# Patient Record
Sex: Female | Born: 1950 | Race: White | Hispanic: No | Smoking: Never smoker
Health system: Southern US, Community
[De-identification: ages and names within clinical notes are randomized; demographics above are authoritative.]

## PROBLEM LIST (undated history)

## (undated) DIAGNOSIS — H269 Unspecified cataract: Secondary | ICD-10-CM

## (undated) DIAGNOSIS — Z9889 Other specified postprocedural states: Secondary | ICD-10-CM

## (undated) DIAGNOSIS — I10 Essential (primary) hypertension: Secondary | ICD-10-CM

## (undated) DIAGNOSIS — R112 Nausea with vomiting, unspecified: Secondary | ICD-10-CM

## (undated) HISTORY — PX: HAND SURGERY: SHX662

## (undated) HISTORY — PX: OTHER SURGICAL HISTORY: SHX169

---

## 1975-12-02 HISTORY — PX: INNER EAR SURGERY: SHX679

## 1998-12-01 HISTORY — PX: FINGER SURGERY: SHX640

## 1999-06-27 ENCOUNTER — Ambulatory Visit (HOSPITAL_BASED_OUTPATIENT_CLINIC_OR_DEPARTMENT_OTHER): Admission: RE | Admit: 1999-06-27 | Discharge: 1999-06-27 | Payer: Self-pay | Admitting: Orthopedic Surgery

## 2002-06-30 ENCOUNTER — Encounter: Payer: Self-pay | Admitting: *Deleted

## 2002-06-30 ENCOUNTER — Ambulatory Visit (HOSPITAL_COMMUNITY): Admission: RE | Admit: 2002-06-30 | Discharge: 2002-06-30 | Payer: Self-pay | Admitting: *Deleted

## 2003-07-07 ENCOUNTER — Ambulatory Visit (HOSPITAL_COMMUNITY): Admission: RE | Admit: 2003-07-07 | Discharge: 2003-07-07 | Payer: Self-pay | Admitting: *Deleted

## 2003-07-07 ENCOUNTER — Encounter: Payer: Self-pay | Admitting: *Deleted

## 2004-06-13 ENCOUNTER — Other Ambulatory Visit: Admission: RE | Admit: 2004-06-13 | Discharge: 2004-06-13 | Payer: Self-pay | Admitting: *Deleted

## 2004-07-09 ENCOUNTER — Ambulatory Visit (HOSPITAL_COMMUNITY): Admission: RE | Admit: 2004-07-09 | Discharge: 2004-07-09 | Payer: Self-pay | Admitting: *Deleted

## 2004-09-03 ENCOUNTER — Ambulatory Visit (HOSPITAL_COMMUNITY): Admission: RE | Admit: 2004-09-03 | Discharge: 2004-09-03 | Payer: Self-pay | Admitting: Internal Medicine

## 2005-07-21 ENCOUNTER — Ambulatory Visit (HOSPITAL_COMMUNITY): Admission: RE | Admit: 2005-07-21 | Discharge: 2005-07-21 | Payer: Self-pay | Admitting: *Deleted

## 2006-08-14 ENCOUNTER — Ambulatory Visit (HOSPITAL_COMMUNITY): Admission: RE | Admit: 2006-08-14 | Discharge: 2006-08-14 | Payer: Self-pay | Admitting: Family Medicine

## 2006-08-20 ENCOUNTER — Ambulatory Visit (HOSPITAL_COMMUNITY): Admission: RE | Admit: 2006-08-20 | Discharge: 2006-08-20 | Payer: Self-pay | Admitting: Family Medicine

## 2006-11-10 ENCOUNTER — Ambulatory Visit (HOSPITAL_COMMUNITY): Admission: RE | Admit: 2006-11-10 | Discharge: 2006-11-10 | Payer: Self-pay | Admitting: Family Medicine

## 2006-11-16 ENCOUNTER — Ambulatory Visit: Payer: Self-pay | Admitting: Orthopedic Surgery

## 2006-11-19 ENCOUNTER — Ambulatory Visit: Payer: Self-pay | Admitting: Orthopedic Surgery

## 2007-09-13 ENCOUNTER — Ambulatory Visit (HOSPITAL_COMMUNITY): Admission: RE | Admit: 2007-09-13 | Discharge: 2007-09-13 | Payer: Self-pay | Admitting: Family Medicine

## 2007-12-06 ENCOUNTER — Ambulatory Visit (HOSPITAL_COMMUNITY): Admission: RE | Admit: 2007-12-06 | Discharge: 2007-12-06 | Payer: Self-pay | Admitting: Family Medicine

## 2008-10-25 ENCOUNTER — Ambulatory Visit (HOSPITAL_COMMUNITY): Admission: RE | Admit: 2008-10-25 | Discharge: 2008-10-25 | Payer: Self-pay | Admitting: Family Medicine

## 2009-11-14 ENCOUNTER — Ambulatory Visit (HOSPITAL_COMMUNITY): Admission: RE | Admit: 2009-11-14 | Discharge: 2009-11-14 | Payer: Self-pay | Admitting: Family Medicine

## 2010-11-18 ENCOUNTER — Ambulatory Visit (HOSPITAL_COMMUNITY)
Admission: RE | Admit: 2010-11-18 | Discharge: 2010-11-18 | Payer: Self-pay | Source: Home / Self Care | Attending: Internal Medicine | Admitting: Internal Medicine

## 2011-02-25 ENCOUNTER — Ambulatory Visit (HOSPITAL_COMMUNITY)
Admission: RE | Admit: 2011-02-25 | Discharge: 2011-02-25 | Disposition: A | Discharge status: Home / Self Care | Payer: BC Managed Care – PPO | Source: Ambulatory Visit | Attending: Family Medicine | Admitting: Family Medicine

## 2011-02-25 ENCOUNTER — Other Ambulatory Visit (HOSPITAL_COMMUNITY): Payer: Self-pay | Admitting: Family Medicine

## 2011-02-25 ENCOUNTER — Encounter (HOSPITAL_COMMUNITY): Payer: Self-pay

## 2011-02-25 DIAGNOSIS — M79601 Pain in right arm: Secondary | ICD-10-CM

## 2011-02-25 DIAGNOSIS — W19XXXA Unspecified fall, initial encounter: Secondary | ICD-10-CM | POA: Insufficient documentation

## 2011-02-25 DIAGNOSIS — M25519 Pain in unspecified shoulder: Secondary | ICD-10-CM | POA: Insufficient documentation

## 2011-02-25 DIAGNOSIS — S46909A Unspecified injury of unspecified muscle, fascia and tendon at shoulder and upper arm level, unspecified arm, initial encounter: Secondary | ICD-10-CM | POA: Insufficient documentation

## 2011-02-25 DIAGNOSIS — M25529 Pain in unspecified elbow: Secondary | ICD-10-CM | POA: Insufficient documentation

## 2011-02-25 DIAGNOSIS — M25511 Pain in right shoulder: Secondary | ICD-10-CM

## 2011-02-25 DIAGNOSIS — S4980XA Other specified injuries of shoulder and upper arm, unspecified arm, initial encounter: Secondary | ICD-10-CM | POA: Insufficient documentation

## 2011-03-28 ENCOUNTER — Encounter: Payer: Self-pay | Admitting: Orthopedic Surgery

## 2011-04-01 ENCOUNTER — Encounter: Payer: Self-pay | Admitting: Orthopedic Surgery

## 2011-04-01 ENCOUNTER — Ambulatory Visit (INDEPENDENT_AMBULATORY_CARE_PROVIDER_SITE_OTHER): Payer: BC Managed Care – PPO | Admitting: Orthopedic Surgery

## 2011-04-01 VITALS — HR 68 | Resp 16 | Ht 64.0 in | Wt 140.0 lb

## 2011-04-01 DIAGNOSIS — M25511 Pain in right shoulder: Secondary | ICD-10-CM

## 2011-04-01 DIAGNOSIS — M25519 Pain in unspecified shoulder: Secondary | ICD-10-CM

## 2011-04-01 DIAGNOSIS — M25619 Stiffness of unspecified shoulder, not elsewhere classified: Secondary | ICD-10-CM

## 2011-04-01 MED ORDER — ACETAMINOPHEN-CODEINE 300-30 MG PO TABS: 1.0000 | ORAL_TABLET | ORAL | Status: DC | PRN

## 2011-04-01 NOTE — Patient Instructions (Signed)
Start Phys Therapy   You have received a steroid shot. 15% of patients experience increased pain at the injection site with in the next 24 hours. This is best treated with ice and tylenol extra strength 2 tabs every 8 hours. If you are still having pain please call the office.   Take medications   nabumetone  robaxin  tylenol with codeine

## 2011-04-01 NOTE — Progress Notes (Signed)
Pain RIGHT arm  Date of injury March of this year  Mechanism patient fell her RIGHT mid humerus head against a chair.  Since that time complains of throbbing mild constant pain worse with motion.  Actually her range of motion is decreased she does get some relief from Relafen and Robaxin  Intensity of pain as 2/10.  Review of systems is reviewed all systems.  All systems negative except for complain of muscle pain.  No ALLERGIES.  No major medical problems.  She did have surgery on her LEFT hand in 2000.  Primary care physician [facility]  Current medications fish oil, Centrum, Robaxin, Relafen.  Family history negative  Social history patient is widowed.  Does not smoke or drink.  Vital signs are recorded and are stable.  General appearance is normal.  Patient oriented x3.  Mood and affect normal.  Ambulation normal.  Cervical spine nontender normal range of motion.  LEFT shoulder inspection normal, range of motion normal.  Strength normal.  Stability test are normal.  Skin is intact and normal.  RIGHT shoulder she is decreased external rotation abduction and forward elevation.  This is painful for her.  She has tenderness in the mid humerus and posterior to the subacromial space.  The shoulder however is stable.  She has weakness in internal/external rotation as well as forward elevation.  Skin is intact.  There is no cervical enlarged lymph nodes.  Sensory exam is normal reflexes are 2+ and equal.  Diagnosis shoulder pain with contusion complicated by adhesive capsulitis.  Clinically does not have rotator cuff tear

## 2011-04-01 NOTE — Discharge Summary (Signed)
Separately identifiable procedure report  Informed consent was obtained verbally.  Time out was taken.  RIGHT shoulder was injected subacromial space.   Alcohol was used to prep the shoulder, along with ethyl chloride.  40 mg of Depo-Medrol and 3 cc of 1% lidocaine was injected into the subacromial space.  There were no complications 

## 2011-04-08 ENCOUNTER — Ambulatory Visit (HOSPITAL_COMMUNITY)
Admission: RE | Admit: 2011-04-08 | Discharge: 2011-04-08 | Disposition: A | Discharge status: Home / Self Care | Payer: BC Managed Care – PPO | Source: Ambulatory Visit | Attending: Orthopedic Surgery | Admitting: Orthopedic Surgery

## 2011-04-08 DIAGNOSIS — M25519 Pain in unspecified shoulder: Secondary | ICD-10-CM | POA: Insufficient documentation

## 2011-04-08 DIAGNOSIS — IMO0001 Reserved for inherently not codable concepts without codable children: Secondary | ICD-10-CM | POA: Insufficient documentation

## 2011-04-08 DIAGNOSIS — M25619 Stiffness of unspecified shoulder, not elsewhere classified: Secondary | ICD-10-CM | POA: Insufficient documentation

## 2011-04-08 DIAGNOSIS — M6281 Muscle weakness (generalized): Secondary | ICD-10-CM | POA: Insufficient documentation

## 2011-04-10 ENCOUNTER — Ambulatory Visit (HOSPITAL_COMMUNITY)
Admission: RE | Admit: 2011-04-10 | Discharge: 2011-04-10 | Disposition: A | Discharge status: Home / Self Care | Payer: BC Managed Care – PPO | Source: Ambulatory Visit | Attending: *Deleted | Admitting: *Deleted

## 2011-04-11 ENCOUNTER — Telehealth: Payer: Self-pay | Admitting: Orthopedic Surgery

## 2011-04-11 NOTE — Telephone Encounter (Signed)
Patient is doing physical therapy as ordered and states would like medication for inflammation, as current one not helping.  Her pharmacy is [facility] in [place].

## 2011-04-14 NOTE — Telephone Encounter (Signed)
Stop nabumetone   Start advil 800 mg 3 times a day

## 2011-04-15 ENCOUNTER — Telehealth: Payer: Self-pay | Admitting: Orthopedic Surgery

## 2011-04-15 NOTE — Telephone Encounter (Signed)
Patient received Dr Mort Sawyers instructions per last telephone note  To stop Nabumetone and start Advil 800 mg 3 times a day.

## 2011-04-15 NOTE — Telephone Encounter (Signed)
Reached and advised patient per Dr Mort Sawyers instructions.

## 2011-04-16 ENCOUNTER — Ambulatory Visit (HOSPITAL_COMMUNITY)
Admission: RE | Admit: 2011-04-16 | Discharge: 2011-04-16 | Disposition: A | Discharge status: Home / Self Care | Payer: BC Managed Care – PPO | Source: Ambulatory Visit | Attending: *Deleted | Admitting: *Deleted

## 2011-04-17 ENCOUNTER — Ambulatory Visit (HOSPITAL_COMMUNITY)
Admission: RE | Admit: 2011-04-17 | Discharge: 2011-04-17 | Disposition: A | Discharge status: Home / Self Care | Payer: BC Managed Care – PPO | Source: Ambulatory Visit | Attending: *Deleted | Admitting: *Deleted

## 2011-04-18 NOTE — Op Note (Signed)
NAMEJINAN, Rachael Rios                 ACCOUNT NO.:  [id number]   MEDICAL RECORD NO.:  [id number]          PATIENT TYPE:  AMB   LOCATION:  DAY                           FACILITY:  [facility]   PHYSICIAN:  R. Roetta Sessions, M.D. DATE OF BIRTH:  Dec 19, 1950   DATE OF PROCEDURE:  09/03/2004  DATE OF DISCHARGE:                                 OPERATIVE REPORT   PROCEDURE:  Colonoscopy.   INDICATIONS:  This is a 59 year old lady devoid of any GI symptoms, referred  at the courtesy of Dr. Lisette Grinder for colorectal cancer screening.  There is no  family history of colorectal neoplasia.  She reports never having  colonoscopy previously.  Colonoscopy is now being done as a standard  screening maneuver.  This procedure has been discussed with the patient at  length.  The potential risks, benefits and alternatives have been reviewed.  Questions answered and she is agreeable.  Please see my handwritten H&P for  more information.   DESCRIPTION OF PROCEDURE:  Oxygen saturation, blood pressure, pulse and  respiration were monitored throughout the entire procedure.  Conscious  sedation with Versed 3 mg IV and Demerol 75 mg IV in divided doses.  The  instrument was the Olympus video chip system.   FINDINGS:  Digital rectal exam initially revealed no abnormalities.   ENDOSCOPIC FINDINGS:  Prep was good.   Rectum:  Examination of the rectal mucosa including retroflexion in the anal  verge revealed only internal hemorrhoids.   Colon:  Colonic mucosa was surveyed from the rectosigmoid junction through  the left, transverse,  right colon to the area of the appendiceal orifice,  ileocecal valve and cecum. These structures were well seen and photographed  for the record.  From this level, the scope was slowly withdrawn.  All  previously mentioned mucosal surfaces were again seen.  The colonic mucosa  appeared normal.  The patient tolerated the procedure well and was reactive  after endoscopy.   IMPRESSION:  1.   Normal rectum.  2.  Normal colon.   RECOMMENDATIONS:  Repeat colonoscopy 10 years.     Otelia Sergeant   RMR/MEDQ  D:  09/03/2004  T:  09/03/2004  Job:  [id number]   cc:   Langley Gauss, M.D.  [address], [address]  [place]  [place] [address]  Fax: [phone]

## 2011-04-22 ENCOUNTER — Ambulatory Visit (HOSPITAL_COMMUNITY)
Admission: RE | Admit: 2011-04-22 | Discharge: 2011-04-22 | Disposition: A | Discharge status: Home / Self Care | Payer: BC Managed Care – PPO | Source: Ambulatory Visit | Attending: *Deleted | Admitting: *Deleted

## 2011-04-24 ENCOUNTER — Ambulatory Visit (HOSPITAL_COMMUNITY)
Admission: RE | Admit: 2011-04-24 | Discharge: 2011-04-24 | Disposition: A | Discharge status: Home / Self Care | Payer: BC Managed Care – PPO | Source: Ambulatory Visit | Attending: *Deleted | Admitting: *Deleted

## 2011-04-29 ENCOUNTER — Ambulatory Visit (HOSPITAL_COMMUNITY)
Admission: RE | Admit: 2011-04-29 | Discharge: 2011-04-29 | Disposition: A | Discharge status: Home / Self Care | Payer: BC Managed Care – PPO | Source: Ambulatory Visit | Attending: *Deleted | Admitting: *Deleted

## 2011-05-01 ENCOUNTER — Ambulatory Visit (HOSPITAL_COMMUNITY)
Admission: RE | Admit: 2011-05-01 | Discharge: 2011-05-01 | Disposition: A | Discharge status: Home / Self Care | Payer: BC Managed Care – PPO | Source: Ambulatory Visit | Attending: *Deleted | Admitting: *Deleted

## 2011-05-07 ENCOUNTER — Ambulatory Visit (HOSPITAL_COMMUNITY)
Admission: RE | Admit: 2011-05-07 | Discharge: 2011-05-07 | Disposition: A | Discharge status: Home / Self Care | Payer: BC Managed Care – PPO | Source: Ambulatory Visit | Attending: Internal Medicine | Admitting: Internal Medicine

## 2011-05-07 DIAGNOSIS — M25619 Stiffness of unspecified shoulder, not elsewhere classified: Secondary | ICD-10-CM | POA: Insufficient documentation

## 2011-05-07 DIAGNOSIS — IMO0001 Reserved for inherently not codable concepts without codable children: Secondary | ICD-10-CM | POA: Insufficient documentation

## 2011-05-07 DIAGNOSIS — M25519 Pain in unspecified shoulder: Secondary | ICD-10-CM | POA: Insufficient documentation

## 2011-05-07 DIAGNOSIS — M6281 Muscle weakness (generalized): Secondary | ICD-10-CM | POA: Insufficient documentation

## 2011-05-08 ENCOUNTER — Ambulatory Visit (HOSPITAL_COMMUNITY)
Admission: RE | Admit: 2011-05-08 | Discharge: 2011-05-08 | Disposition: A | Discharge status: Home / Self Care | Payer: BC Managed Care – PPO | Source: Ambulatory Visit | Attending: Internal Medicine | Admitting: Internal Medicine

## 2011-05-13 ENCOUNTER — Ambulatory Visit (HOSPITAL_COMMUNITY)
Admission: RE | Admit: 2011-05-13 | Discharge: 2011-05-13 | Disposition: A | Discharge status: Home / Self Care | Payer: BC Managed Care – PPO | Source: Ambulatory Visit | Attending: Physical Therapy | Admitting: Physical Therapy

## 2011-05-15 ENCOUNTER — Ambulatory Visit (HOSPITAL_COMMUNITY)
Admission: RE | Admit: 2011-05-15 | Discharge: 2011-05-15 | Disposition: A | Discharge status: Home / Self Care | Payer: BC Managed Care – PPO | Source: Ambulatory Visit | Attending: Internal Medicine | Admitting: Internal Medicine

## 2011-05-20 ENCOUNTER — Ambulatory Visit (HOSPITAL_COMMUNITY)
Admission: RE | Admit: 2011-05-20 | Discharge: 2011-05-20 | Disposition: A | Discharge status: Home / Self Care | Payer: BC Managed Care – PPO | Source: Ambulatory Visit | Attending: Internal Medicine | Admitting: Internal Medicine

## 2011-05-22 ENCOUNTER — Ambulatory Visit (HOSPITAL_COMMUNITY)
Admission: RE | Admit: 2011-05-22 | Discharge: 2011-05-22 | Disposition: A | Discharge status: Home / Self Care | Payer: BC Managed Care – PPO | Source: Ambulatory Visit | Attending: Internal Medicine | Admitting: Internal Medicine

## 2011-05-27 ENCOUNTER — Ambulatory Visit (HOSPITAL_COMMUNITY): Payer: BC Managed Care – PPO | Admitting: *Deleted

## 2011-05-29 ENCOUNTER — Ambulatory Visit (HOSPITAL_COMMUNITY): Payer: BC Managed Care – PPO

## 2011-11-18 ENCOUNTER — Other Ambulatory Visit (HOSPITAL_COMMUNITY): Payer: Self-pay | Admitting: Internal Medicine

## 2011-11-18 ENCOUNTER — Other Ambulatory Visit (HOSPITAL_COMMUNITY): Payer: Self-pay | Admitting: Family Medicine

## 2011-11-18 DIAGNOSIS — Z139 Encounter for screening, unspecified: Secondary | ICD-10-CM

## 2011-11-28 ENCOUNTER — Ambulatory Visit (HOSPITAL_COMMUNITY)
Admission: RE | Admit: 2011-11-28 | Discharge: 2011-11-28 | Disposition: A | Discharge status: Home / Self Care | Payer: BC Managed Care – PPO | Source: Ambulatory Visit | Attending: Family Medicine | Admitting: Family Medicine

## 2011-11-28 DIAGNOSIS — Z139 Encounter for screening, unspecified: Secondary | ICD-10-CM

## 2011-11-28 DIAGNOSIS — Z1231 Encounter for screening mammogram for malignant neoplasm of breast: Secondary | ICD-10-CM | POA: Insufficient documentation

## 2011-12-05 ENCOUNTER — Other Ambulatory Visit: Payer: Self-pay | Admitting: Internal Medicine

## 2011-12-05 DIAGNOSIS — R928 Other abnormal and inconclusive findings on diagnostic imaging of breast: Secondary | ICD-10-CM

## 2011-12-10 ENCOUNTER — Ambulatory Visit (HOSPITAL_COMMUNITY)
Admission: RE | Admit: 2011-12-10 | Discharge: 2011-12-10 | Disposition: A | Discharge status: Home / Self Care | Payer: BC Managed Care – PPO | Source: Ambulatory Visit | Attending: Internal Medicine | Admitting: Internal Medicine

## 2011-12-10 ENCOUNTER — Other Ambulatory Visit (HOSPITAL_COMMUNITY): Payer: Self-pay | Admitting: Internal Medicine

## 2011-12-10 DIAGNOSIS — R928 Other abnormal and inconclusive findings on diagnostic imaging of breast: Secondary | ICD-10-CM

## 2011-12-10 DIAGNOSIS — N63 Unspecified lump in unspecified breast: Secondary | ICD-10-CM | POA: Insufficient documentation

## 2011-12-17 ENCOUNTER — Encounter (HOSPITAL_COMMUNITY): Payer: BC Managed Care – PPO

## 2012-02-25 ENCOUNTER — Encounter: Payer: Self-pay | Admitting: Orthopedic Surgery

## 2012-02-25 ENCOUNTER — Ambulatory Visit (INDEPENDENT_AMBULATORY_CARE_PROVIDER_SITE_OTHER): Payer: BC Managed Care – PPO | Admitting: Orthopedic Surgery

## 2012-02-25 VITALS — BP 130/90 | Ht 64.0 in | Wt 122.0 lb

## 2012-02-25 DIAGNOSIS — M25511 Pain in right shoulder: Secondary | ICD-10-CM

## 2012-02-25 DIAGNOSIS — M25519 Pain in unspecified shoulder: Secondary | ICD-10-CM

## 2012-02-25 DIAGNOSIS — M719 Bursopathy, unspecified: Secondary | ICD-10-CM

## 2012-02-25 DIAGNOSIS — M75102 Unspecified rotator cuff tear or rupture of left shoulder, not specified as traumatic: Secondary | ICD-10-CM | POA: Insufficient documentation

## 2012-02-25 MED ORDER — ACETAMINOPHEN-CODEINE 300-30 MG PO TABS: 1.0000 | ORAL_TABLET | ORAL | Status: DC | PRN

## 2012-02-25 NOTE — Progress Notes (Signed)
Patient ID: Rachael Rios, female   DOB: August 02, 1951, 61 y.o.   MRN: [id number] Chief Complaint  Patient presents with  . Shoulder Pain    left shoulder pain, injured April 2012, no previous treatment    New patient  LEFT shoulder pain as stated this started in November or December with a fall. She complains of 2/10. Throbbing constant pain in the morning and night in stiffness in the LEFT upper extremity with catching especially when forward elevation is attempted.  Review of systems negative except for joint pain and muscle pain.  History reviewed. No pertinent past medical history.  Past Surgical History  Procedure Date  . Left hand   . Hand surgery     Vital signs are stable as recorded  General appearance is normal  The patient is alert and oriented x3  The patient's mood and affect are normal  Gait assessment: normal  The cardiovascular exam reveals normal pulses and temperature without edema swelling.  The lymphatic system is negative for palpable lymph nodes  The sensory exam is normal.  There are no pathologic reflexes.  Balance is normal.   Exam of the left shoulder  Inspection She has tenderness around the acromion and lateral deltoid. Range of motion 20, external rotation. 70, abduction. 100, forward elevation. Passive range of motion is external rotation same. Abduction same with forward elevation 130 with impingement sign. Stability Stable shoulder Strength Weak rotator cuff somewhat secondary to pain Skin Intact  RIGHT shoulder range of motion strength, stability, normal. No tenderness  xrays  Separately identifiable x-ray report   AP and lateral with 10 caudal tilt left  shoulder  Normal glenohumeral joint normal rotator outlet but no signs of chronic cuff disease  Impression normal shoulder x-rays  Subacromial Shoulder Injection Procedure Note  Pre-operative Diagnosis: left RC Syndrome  Post-operative Diagnosis: same  Indications:  pain   Anesthesia: ethyl chloride   Procedure Details   Verbal consent was obtained for the procedure. The shoulder was prepped withalcohol and the skin was anesthetized. A 20 gauge needle was advanced into the subacromial space through posterior approach without difficulty  The space was then injected with 3 ml 1% lidocaine and 1 ml of depomedrol. The injection site was cleansed with isopropyl alcohol and a dressing was applied.  Complications:  None; patient tolerated the procedure well.

## 2012-02-25 NOTE — Patient Instructions (Addendum)
You have received a steroid shot. 15% of patients experience increased pain at the injection site with in the next 24 hours. This is best treated with ice and tylenol extra strength 2 tabs every 8 hours. If you are still having pain please call the office.   Rotator Cuff Tendonitis   The rotator cuff is the collection of all the muscles and tendons (the supraspinatus, infraspinatus, subscapularis, and teres minor muscles and their tendons) that help your shoulder stay in place. This unit holds the head of the upper arm bone (humerus) in the cup (fossa) of the shoulder blade (scapula). Basically, it connects the arm to the shoulder. Tendinitis is a swelling and irritation of the tissue, called cord like structures (tendons) that connect muscle to bone. It usually is caused by overusing the joint involved. When the tissue surrounding a tendon (the synovium) becomes inflamed, it is called tenosynovitis. This also is often the result of overuse in people whose jobs require repetitive (over and over again) types of motion. HOME CARE INSTRUCTIONS    Use a sling or splint for as long as directed by your caregiver until the pain decreases.   Apply ice to the injury for 15 to 20 minutes, 3 to 4 times per day. Put the ice in a plastic bag and place a towel between the bag of ice and your skin.   Try to avoid use other than gentle range of motion while your shoulder is painful. Use and exercise only as directed by your caregiver. Stop exercises or range of motion if pain or discomfort increases, unless directed otherwise by your caregiver.   Only take over-the-counter or prescription medicines for pain, discomfort, or fever as directed by your caregiver.   If you were give a shoulder sling and straps (immobilizer), do not remove it except as directed, or until you see a caregiver for a follow-up examination. If you need to remove it, move your arm as little as possible or as directed.   You may want to sleep on  several pillows at night to lessen swelling and pain.  SEEK IMMEDIATE MEDICAL CARE IF:    Pain in your shoulder increases or new pain develops in your arm, hand, or fingers and is not relieved with medications.   You develop new, unexplained symptoms, especially increased numbness in the hands or loss of strength, or you develop any worsening of the problems which brought you in for care.   Your arm, hand, or fingers are numb or tingling.   Your arm, hand, or fingers are swollen, painful, or turn white or blue.  Document Released: 02/07/2004 Document Revised: 11/06/2011 Document Reviewed: 09/14/2008 [facility] Patient Information 2012 [place], [place].Adhesive Capsulitis Sometimes the shoulder becomes stiff and is painful to move. Some people say it feels as if the shoulder is frozen in place. Because of this, the condition is called "frozen shoulder." Its medical name is adhesive capsulitis.   The shoulder joint is made up of strong connective tissue that attaches the ball of the humerus to the shallow shoulder socket. This strong connective tissue is called the joint capsule. This tissue can become stiff and swollen. That is when adhesive capsulitis sets in. CAUSES   It is not always clear just what the cause adhesive capsulitis. Possibilities include:  Injury to the shoulder joint.   Strain. This is a repetitive injury brought about by overuse.   Lack of use. Perhaps your arm or hand was otherwise injured. It might have been in  a sling for awhile. Or perhaps you were not using it to avoid pain.   Referred pain. This is a sort of trick the body plays. You feel pain in the shoulder. But, the pain actually comes from an injury somewhere else in the body.   Long-standing health problems. Several diseases can cause adhesive capsulitis. They include diabetes, heart disease, stroke, thyroid problems, rheumatoid arthritis and lung disease.   Being a women older than 40. Anyone can develop adhesive  capsulitis but it is most common in women in this age group.  SYMPTOMS    Pain.   It occurs when the arm is moved.   Parts of the shoulder might hurt if they are touched.   Pain is worse at night or when resting.   Soreness. It might not be strong enough to be called pain. But, the shoulder aches.   The shoulder does not move freely.   Muscle spasms.   Trouble sleeping because of shoulder ache or pain.  DIAGNOSIS   To decide if you have adhesive capsulitis, your healthcare provider will probably:  Ask about symptoms you have noticed.   Ask about your history of joint pain and anything that might have caused the pain.   Ask about your overall health.   Use hands to feel your shoulder and neck.   Ask you to move your shoulder in specific directions. This may indicate the origin of the pain.   Order imaging tests; pictures of the shoulder. They help pinpoint the source of the problem. An X-ray might be used. For more detail, an MRI is often used. An MRI details the tendons, muscles and ligaments as well as the joint.  TREATMENT   Adhesive capsulitis can be treated several ways. Most treatments can be done in a clinic or in your healthcare provider's office. Be sure to discuss the different options with your caregiver. They include:  Physical therapy. You will work on specific exercises to get your shoulder moving again. The exercises usually involve stretching. A physical therapist (a caregiver with special training) can show you what to do and what not to do. The exercises will need to be done daily.   Medication.   Over-the-counter medicines may relieve pain and inflammation (the body's way of reacting to injury or infection).   Corticosteroids. These are stronger drugs to reduce pain and inflammation. They are given by injection (shots) into the shoulder joint. Frequent treatment is not recommended.   Muscle relaxants. Medication may be prescribed to ease muscle spasms.    Treatment of underlying conditions. This means treating another condition that is causing your shoulder problem. This might be a rotator cuff (tendon) problem   Shoulder manipulation. The shoulder will be moved by your healthcare provider. You would be under general anesthesia (given a drug that puts you to sleep). You would not feel anything. Sometimes the joint will be injected with salt water (saline) at high pressure to break down internal scarring in the joint capsule.   Surgery. This is rarely needed. It may be suggested in advanced cases after all other treatment has failed.  PROGNOSIS   In time, most people recover from adhesive capsulitis. Sometimes, however, the pain goes away but full movement of the shoulder does not return.   HOME CARE INSTRUCTIONS    Take any pain medications recommended by your healthcare provider. Follow the directions carefully.   If you have physical therapy, follow through with the therapist's suggestions. Be sure you understand the  exercises you will be doing. You should understand:   How often the exercises should be done.   How many times each exercise should be repeated.   How long they should be done.   What other activities you should do, or not do.   That you should warm up before doing any exercise. Just 5 to 10 minutes will help. Small, gentle movements should get your shoulder ready for more.   Avoid high-demand exercise that involves your shoulder such as throwing. This type of exercise can make pain worse.   Consider using cold packs. Cold may ease swelling and pain. Ask your healthcare provider if a cold pack might help you. If so, get directions on how and when to use them.  SEEK MEDICAL CARE IF:    You have any questions about your medications.   Your pain continues to increase.  Document Released: 09/14/2009 Document Revised: 11/06/2011 Document Reviewed: 09/14/2009 [facility] Patient Information 2012 [place], [place].

## 2012-03-29 ENCOUNTER — Other Ambulatory Visit: Payer: Self-pay | Admitting: Orthopedic Surgery

## 2012-03-29 NOTE — Telephone Encounter (Signed)
Rachael Rios says the Tylenol/Codeine 3/300 does not help her pain for very long.  She is doing the HEP daily at home and the pain is worse after the exercises, also is having some swelling down into her elbow.  Asking if there is something else you can prescribe as she is having the shoulder pain each time she uses her arm.  She uses [facility] in [place]

## 2012-03-30 NOTE — Telephone Encounter (Signed)
Called patient, no answer 

## 2012-03-30 NOTE — Telephone Encounter (Signed)
Ibuprofen 800 mg tid # 90 if she cant take that then   Or naprosyn 500 mg bid # 60   Or diclofenac 50 mg bid # 60

## 2012-03-31 MED ORDER — IBUPROFEN 800 MG PO TABS: 800.0000 mg | ORAL_TABLET | Freq: Three times a day (TID) | ORAL | Status: AC | PRN

## 2012-03-31 NOTE — Telephone Encounter (Signed)
Ibuprofen sent, patient aware

## 2012-12-13 ENCOUNTER — Other Ambulatory Visit (HOSPITAL_COMMUNITY): Payer: Self-pay | Admitting: Internal Medicine

## 2012-12-13 DIAGNOSIS — Z139 Encounter for screening, unspecified: Secondary | ICD-10-CM

## 2012-12-20 ENCOUNTER — Ambulatory Visit (HOSPITAL_COMMUNITY): Payer: BC Managed Care – PPO

## 2012-12-27 ENCOUNTER — Ambulatory Visit (HOSPITAL_COMMUNITY)
Admission: RE | Admit: 2012-12-27 | Discharge: 2012-12-27 | Disposition: A | Discharge status: Home / Self Care | Payer: BC Managed Care – PPO | Source: Ambulatory Visit | Attending: Internal Medicine | Admitting: Internal Medicine

## 2012-12-27 DIAGNOSIS — Z139 Encounter for screening, unspecified: Secondary | ICD-10-CM

## 2012-12-27 DIAGNOSIS — Z1231 Encounter for screening mammogram for malignant neoplasm of breast: Secondary | ICD-10-CM | POA: Insufficient documentation

## 2013-12-19 ENCOUNTER — Other Ambulatory Visit (HOSPITAL_COMMUNITY): Payer: Self-pay | Admitting: Internal Medicine

## 2013-12-19 DIAGNOSIS — Z139 Encounter for screening, unspecified: Secondary | ICD-10-CM

## 2013-12-29 ENCOUNTER — Ambulatory Visit (HOSPITAL_COMMUNITY)
Admission: RE | Admit: 2013-12-29 | Discharge: 2013-12-29 | Disposition: A | Discharge status: Home / Self Care | Payer: BC Managed Care – PPO | Source: Ambulatory Visit | Attending: Internal Medicine | Admitting: Internal Medicine

## 2013-12-29 DIAGNOSIS — Z139 Encounter for screening, unspecified: Secondary | ICD-10-CM

## 2013-12-29 DIAGNOSIS — Z1231 Encounter for screening mammogram for malignant neoplasm of breast: Secondary | ICD-10-CM | POA: Insufficient documentation

## 2014-01-09 DIAGNOSIS — IMO0002 Reserved for concepts with insufficient information to code with codable children: Secondary | ICD-10-CM | POA: Diagnosis not present

## 2014-01-09 DIAGNOSIS — I10 Essential (primary) hypertension: Secondary | ICD-10-CM | POA: Diagnosis not present

## 2014-01-09 DIAGNOSIS — R7301 Impaired fasting glucose: Secondary | ICD-10-CM | POA: Diagnosis not present

## 2014-01-09 DIAGNOSIS — Z713 Dietary counseling and surveillance: Secondary | ICD-10-CM | POA: Diagnosis not present

## 2014-03-21 DIAGNOSIS — H66019 Acute suppurative otitis media with spontaneous rupture of ear drum, unspecified ear: Secondary | ICD-10-CM | POA: Diagnosis not present

## 2014-03-21 DIAGNOSIS — IMO0002 Reserved for concepts with insufficient information to code with codable children: Secondary | ICD-10-CM | POA: Diagnosis not present

## 2014-03-21 DIAGNOSIS — J0100 Acute maxillary sinusitis, unspecified: Secondary | ICD-10-CM | POA: Diagnosis not present

## 2014-03-21 DIAGNOSIS — I10 Essential (primary) hypertension: Secondary | ICD-10-CM | POA: Diagnosis not present

## 2014-04-17 DIAGNOSIS — J301 Allergic rhinitis due to pollen: Secondary | ICD-10-CM | POA: Diagnosis not present

## 2014-04-17 DIAGNOSIS — H1045 Other chronic allergic conjunctivitis: Secondary | ICD-10-CM | POA: Diagnosis not present

## 2014-04-17 DIAGNOSIS — I10 Essential (primary) hypertension: Secondary | ICD-10-CM | POA: Diagnosis not present

## 2014-04-17 DIAGNOSIS — IMO0002 Reserved for concepts with insufficient information to code with codable children: Secondary | ICD-10-CM | POA: Diagnosis not present

## 2014-05-04 DIAGNOSIS — I10 Essential (primary) hypertension: Secondary | ICD-10-CM | POA: Diagnosis not present

## 2014-05-04 DIAGNOSIS — IMO0002 Reserved for concepts with insufficient information to code with codable children: Secondary | ICD-10-CM | POA: Diagnosis not present

## 2014-05-04 DIAGNOSIS — Z0000 Encounter for general adult medical examination without abnormal findings: Secondary | ICD-10-CM | POA: Diagnosis not present

## 2014-05-04 DIAGNOSIS — J301 Allergic rhinitis due to pollen: Secondary | ICD-10-CM | POA: Diagnosis not present

## 2014-05-29 DIAGNOSIS — M899 Disorder of bone, unspecified: Secondary | ICD-10-CM | POA: Diagnosis not present

## 2014-05-29 DIAGNOSIS — I10 Essential (primary) hypertension: Secondary | ICD-10-CM | POA: Diagnosis not present

## 2014-05-29 DIAGNOSIS — M546 Pain in thoracic spine: Secondary | ICD-10-CM | POA: Diagnosis not present

## 2014-05-29 DIAGNOSIS — IMO0002 Reserved for concepts with insufficient information to code with codable children: Secondary | ICD-10-CM | POA: Diagnosis not present

## 2014-05-29 DIAGNOSIS — M949 Disorder of cartilage, unspecified: Secondary | ICD-10-CM | POA: Diagnosis not present

## 2014-05-29 DIAGNOSIS — R55 Syncope and collapse: Secondary | ICD-10-CM | POA: Diagnosis not present

## 2014-06-05 DIAGNOSIS — I10 Essential (primary) hypertension: Secondary | ICD-10-CM | POA: Diagnosis not present

## 2014-06-21 ENCOUNTER — Other Ambulatory Visit (HOSPITAL_COMMUNITY): Payer: Self-pay | Admitting: Family Medicine

## 2014-06-21 ENCOUNTER — Ambulatory Visit (HOSPITAL_COMMUNITY)
Admission: RE | Admit: 2014-06-21 | Discharge: 2014-06-21 | Disposition: A | Discharge status: Home / Self Care | Payer: Medicare Other | Source: Ambulatory Visit | Attending: Family Medicine | Admitting: Family Medicine

## 2014-06-21 DIAGNOSIS — R1031 Right lower quadrant pain: Secondary | ICD-10-CM | POA: Insufficient documentation

## 2014-06-21 DIAGNOSIS — N3289 Other specified disorders of bladder: Secondary | ICD-10-CM | POA: Diagnosis not present

## 2014-06-21 DIAGNOSIS — Z681 Body mass index (BMI) 19 or less, adult: Secondary | ICD-10-CM | POA: Diagnosis not present

## 2014-06-21 MED ORDER — IOHEXOL 300 MG/ML  SOLN
100.0000 mL | Freq: Once | INTRAMUSCULAR | Status: AC | PRN
  Administered 2014-06-21: 100 mL via INTRAVENOUS

## 2014-07-31 DIAGNOSIS — Z681 Body mass index (BMI) 19 or less, adult: Secondary | ICD-10-CM | POA: Diagnosis not present

## 2014-07-31 DIAGNOSIS — J019 Acute sinusitis, unspecified: Secondary | ICD-10-CM | POA: Diagnosis not present

## 2014-07-31 DIAGNOSIS — R197 Diarrhea, unspecified: Secondary | ICD-10-CM | POA: Diagnosis not present

## 2014-08-08 DIAGNOSIS — I10 Essential (primary) hypertension: Secondary | ICD-10-CM | POA: Diagnosis not present

## 2014-10-18 DIAGNOSIS — Z6820 Body mass index (BMI) 20.0-20.9, adult: Secondary | ICD-10-CM | POA: Diagnosis not present

## 2014-10-18 DIAGNOSIS — I10 Essential (primary) hypertension: Secondary | ICD-10-CM | POA: Diagnosis not present

## 2014-10-18 DIAGNOSIS — Z23 Encounter for immunization: Secondary | ICD-10-CM | POA: Diagnosis not present

## 2014-10-18 DIAGNOSIS — J300 Vasomotor rhinitis: Secondary | ICD-10-CM | POA: Diagnosis not present

## 2015-01-10 DIAGNOSIS — H25049 Posterior subcapsular polar age-related cataract, unspecified eye: Secondary | ICD-10-CM | POA: Diagnosis not present

## 2015-01-10 DIAGNOSIS — H524 Presbyopia: Secondary | ICD-10-CM | POA: Diagnosis not present

## 2015-01-10 DIAGNOSIS — H5202 Hypermetropia, left eye: Secondary | ICD-10-CM | POA: Diagnosis not present

## 2015-01-10 DIAGNOSIS — H5211 Myopia, right eye: Secondary | ICD-10-CM | POA: Diagnosis not present

## 2015-01-18 DIAGNOSIS — J329 Chronic sinusitis, unspecified: Secondary | ICD-10-CM | POA: Diagnosis not present

## 2015-01-18 DIAGNOSIS — Z6820 Body mass index (BMI) 20.0-20.9, adult: Secondary | ICD-10-CM | POA: Diagnosis not present

## 2015-01-18 DIAGNOSIS — R05 Cough: Secondary | ICD-10-CM | POA: Diagnosis not present

## 2015-01-30 ENCOUNTER — Other Ambulatory Visit (HOSPITAL_COMMUNITY): Payer: Self-pay | Admitting: Internal Medicine

## 2015-01-30 DIAGNOSIS — Z1231 Encounter for screening mammogram for malignant neoplasm of breast: Secondary | ICD-10-CM

## 2015-02-05 ENCOUNTER — Ambulatory Visit (HOSPITAL_COMMUNITY)
Admission: RE | Admit: 2015-02-05 | Discharge: 2015-02-05 | Disposition: A | Discharge status: Home / Self Care | Payer: Medicare Other | Source: Ambulatory Visit | Attending: Internal Medicine | Admitting: Internal Medicine

## 2015-02-05 DIAGNOSIS — Z1231 Encounter for screening mammogram for malignant neoplasm of breast: Secondary | ICD-10-CM

## 2015-02-19 DIAGNOSIS — H25811 Combined forms of age-related cataract, right eye: Secondary | ICD-10-CM | POA: Diagnosis not present

## 2015-02-22 NOTE — Patient Instructions (Signed)
Your procedure is scheduled on: 03/01/2015  Report to [facility] at  840   AM.  Call this number if you have problems the morning of surgery: [phone]   Do not eat food or drink liquids :After Midnight.      Take these medicines the morning of surgery with A SIP OF WATER: Tylenol #3, robaxin, relafen   Do not wear jewelry, make-up or nail polish.  Do not wear lotions, powders, or perfumes.   Do not shave 48 hours prior to surgery.  Do not bring valuables to the hospital.  Contacts, dentures or bridgework may not be worn into surgery.  Leave suitcase in the car. After surgery it may be brought to your room.  For patients admitted to the hospital, checkout time is 11:00 AM the day of discharge.   Patients discharged the day of surgery will not be allowed to drive home.  :     Please read over the following fact sheets that you were given: Coughing and Deep Breathing, Surgical Site Infection Prevention, Anesthesia Post-op Instructions and Care and Recovery After Surgery    Cataract A cataract is a clouding of the lens of the eye. When a lens becomes cloudy, vision is reduced based on the degree and nature of the clouding. Many cataracts reduce vision to some degree. Some cataracts make people more near-sighted as they develop. Other cataracts increase glare. Cataracts that are ignored and become worse can sometimes look white. The white color can be seen through the pupil. CAUSES   Aging. However, cataracts may occur at any age, even in newborns.   Certain drugs.   Trauma to the eye.   Certain diseases such as diabetes.   Specific eye diseases such as chronic inflammation inside the eye or a sudden attack of a rare form of glaucoma.   Inherited or acquired medical problems.  SYMPTOMS   Gradual, progressive drop in vision in the affected eye.   Severe, rapid visual loss. This most often happens when trauma is the cause.  DIAGNOSIS  To detect a cataract, an eye doctor examines  the lens. Cataracts are best diagnosed with an exam of the eyes with the pupils enlarged (dilated) by drops.  TREATMENT  For an early cataract, vision may improve by using different eyeglasses or stronger lighting. If that does not help your vision, surgery is the only effective treatment. A cataract needs to be surgically removed when vision loss interferes with your everyday activities, such as driving, reading, or watching TV. A cataract may also have to be removed if it prevents examination or treatment of another eye problem. Surgery removes the cloudy lens and usually replaces it with a substitute lens (intraocular lens, IOL).  At a time when both you and your doctor agree, the cataract will be surgically removed. If you have cataracts in both eyes, only one is usually removed at a time. This allows the operated eye to heal and be out of danger from any possible problems after surgery (such as infection or poor wound healing). In rare cases, a cataract may be doing damage to your eye. In these cases, your caregiver may advise surgical removal right away. The vast majority of people who have cataract surgery have better vision afterward. HOME CARE INSTRUCTIONS  If you are not planning surgery, you may be asked to do the following:  Use different eyeglasses.   Use stronger or brighter lighting.   Ask your eye doctor about reducing your medicine dose  or changing medicines if it is thought that a medicine caused your cataract. Changing medicines does not make the cataract go away on its own.   Become familiar with your surroundings. Poor vision can lead to injury. Avoid bumping into things on the affected side. You are at a higher risk for tripping or falling.   Exercise extreme care when driving or operating machinery.   Wear sunglasses if you are sensitive to bright light or experiencing problems with glare.  SEEK IMMEDIATE MEDICAL CARE IF:   You have a worsening or sudden vision loss.    You notice redness, swelling, or increasing pain in the eye.   You have a fever.  Document Released: 11/17/2005 Document Revised: 11/06/2011 Document Reviewed: 07/11/2011 [facility] Patient Information 2012 [place].PATIENT INSTRUCTIONS POST-ANESTHESIA  IMMEDIATELY FOLLOWING SURGERY:  Do not drive or operate machinery for the first twenty four hours after surgery.  Do not make any important decisions for twenty four hours after surgery or while taking narcotic pain medications or sedatives.  If you develop intractable nausea and vomiting or a severe headache please notify your doctor immediately.  FOLLOW-UP:  Please make an appointment with your surgeon as instructed. You do not need to follow up with anesthesia unless specifically instructed to do so.  WOUND CARE INSTRUCTIONS (if applicable):  Keep a dry clean dressing on the anesthesia/puncture wound site if there is drainage.  Once the wound has quit draining you may leave it open to air.  Generally you should leave the bandage intact for twenty four hours unless there is drainage.  If the epidural site drains for more than 36-48 hours please call the anesthesia department.  QUESTIONS?:  Please feel free to call your physician or the hospital operator if you have any questions, and they will be happy to assist you.

## 2015-02-27 ENCOUNTER — Inpatient Hospital Stay (HOSPITAL_COMMUNITY)
Admission: RE | Admit: 2015-02-27 | Discharge: 2015-02-27 | Disposition: A | Discharge status: Home / Self Care | Payer: Medicare Other | Source: Ambulatory Visit

## 2015-03-01 ENCOUNTER — Encounter (HOSPITAL_COMMUNITY): Admission: RE | Payer: Self-pay | Source: Ambulatory Visit

## 2015-03-01 ENCOUNTER — Ambulatory Visit (HOSPITAL_COMMUNITY): Admission: RE | Admit: 2015-03-01 | Payer: Medicare Other | Source: Ambulatory Visit | Admitting: Ophthalmology

## 2015-03-01 SURGERY — PHACOEMULSIFICATION, CATARACT, WITH IOL INSERTION
Anesthesia: Monitor Anesthesia Care | Site: Eye | Laterality: Right

## 2015-03-20 NOTE — Patient Instructions (Signed)
Rachael Rios  03/20/2015   Your procedure is scheduled on:  03/26/2015  Report to [facility] at 10:00 AM.  Call this number if you have problems the morning of surgery: [phone]   Remember:   Do not eat food or drink liquids after midnight.   Take these medicines the morning of surgery with A SIP OF WATER: Cozaar   Do not wear jewelry, make-up or nail polish.  Do not wear lotions, powders, or perfumes. You may wear deodorant.  Do not shave 48 hours prior to surgery. Men may shave face and neck.  Do not bring valuables to the hospital.  [facility] is not responsible for any belongings or valuables.               Contacts, dentures or bridgework may not be worn into surgery.  Leave suitcase in the car. After surgery it may be brought to your room.  For patients admitted to the hospital, discharge time is determined by your treatment team.               Patients discharged the day of surgery will not be allowed to drive home.  Name and phone number of your driver:   Special Instructions: N/A   Please read over the following fact sheets that you were given: Anesthesia Post-op Instructions   PATIENT INSTRUCTIONS POST-ANESTHESIA  IMMEDIATELY FOLLOWING SURGERY:  Do not drive or operate machinery for the first twenty four hours after surgery.  Do not make any important decisions for twenty four hours after surgery or while taking narcotic pain medications or sedatives.  If you develop intractable nausea and vomiting or a severe headache please notify your doctor immediately.  FOLLOW-UP:  Please make an appointment with your surgeon as instructed. You do not need to follow up with anesthesia unless specifically instructed to do so.  WOUND CARE INSTRUCTIONS (if applicable):  Keep a dry clean dressing on the anesthesia/puncture wound site if there is drainage.  Once the wound has quit draining you may leave it open to air.  Generally you should leave the bandage intact for twenty four  hours unless there is drainage.  If the epidural site drains for more than 36-48 hours please call the anesthesia department.  QUESTIONS?:  Please feel free to call your physician or the hospital operator if you have any questions, and they will be happy to assist you.      Cataract Surgery  A cataract is a clouding of the lens of the eye. When a lens becomes cloudy, vision is reduced based on the degree and nature of the clouding. Surgery may be needed to improve vision. Surgery removes the cloudy lens and usually replaces it with a substitute lens (intraocular lens, IOL). LET YOUR EYE DOCTOR KNOW ABOUT:  Allergies to food or medicine.  Medicines taken including herbs, eye drops, over-the-counter medicines, and creams.  Use of steroids (by mouth or creams).  Previous problems with anesthetics or numbing medicine.  History of bleeding problems or blood clots.  Previous surgery.  Other health problems, including diabetes and kidney problems.  Possibility of pregnancy, if this applies. RISKS AND COMPLICATIONS  Infection.  Inflammation of the eyeball (endophthalmitis) that can spread to both eyes (sympathetic ophthalmia).  Poor wound healing.  If an IOL is inserted, it can later fall out of proper position. This is very uncommon.  Clouding of the part of your eye that holds an IOL in place. This is called an "after-cataract." These  are uncommon but easily treated. BEFORE THE PROCEDURE  Do not eat or drink anything except small amounts of water for 8 to 12 before your surgery, or as directed by your caregiver.  Unless you are told otherwise, continue any eye drops you have been prescribed.  Talk to your primary caregiver about all other medicines that you take (both prescription and nonprescription). In some cases, you may need to stop or change medicines near the time of your surgery. This is most important if you are taking blood-thinning medicine.Do not stop medicines unless  you are told to do so.  Arrange for someone to drive you to and from the procedure.  Do not put contact lenses in either eye on the day of your surgery. PROCEDURE There is more than one method for safely removing a cataract. Your doctor can explain the differences and help determine which is best for you. Phacoemulsification surgery is the most common form of cataract surgery.  An injection is given behind the eye or eye drops are given to make this a painless procedure.  A small cut (incision) is made on the edge of the clear, dome-shaped surface that covers the front of the eye (cornea).  A tiny probe is painlessly inserted into the eye. This device gives off ultrasound waves that soften and break up the cloudy center of the lens. This makes it easier for the cloudy lens to be removed by suction.  An IOL may be implanted.  The normal lens of the eye is covered by a clear capsule. Part of that capsule is intentionally left in the eye to support the IOL.  Your surgeon may or may not use stitches to close the incision. There are other forms of cataract surgery that require a larger incision and stitches to close the eye. This approach is taken in cases where the doctor feels that the cataract cannot be easily removed using phacoemulsification. AFTER THE PROCEDURE  When an IOL is implanted, it does not need care. It becomes a permanent part of your eye and cannot be seen or felt.  Your doctor will schedule follow-up exams to check on your progress.  Review your other medicines with your doctor to see which can be resumed after surgery.  Use eye drops or take medicine as prescribed by your doctor. Document Released: 11/06/2011 Document Revised: 04/03/2014 Document Reviewed: 11/06/2011 [facility] Patient Information 2015 [place], [place]. This information is not intended to replace advice given to you by your health care provider. Make sure you discuss any questions you have with your health  care provider.

## 2015-03-21 ENCOUNTER — Encounter (HOSPITAL_COMMUNITY): Payer: Self-pay

## 2015-03-21 ENCOUNTER — Encounter (HOSPITAL_COMMUNITY)
Admission: RE | Admit: 2015-03-21 | Discharge: 2015-03-21 | Disposition: A | Discharge status: Home / Self Care | Payer: Medicare Other | Source: Ambulatory Visit | Attending: Ophthalmology | Admitting: Ophthalmology

## 2015-03-21 ENCOUNTER — Other Ambulatory Visit (HOSPITAL_COMMUNITY): Payer: Self-pay

## 2015-03-21 DIAGNOSIS — Z01810 Encounter for preprocedural cardiovascular examination: Secondary | ICD-10-CM | POA: Insufficient documentation

## 2015-03-21 DIAGNOSIS — Z01812 Encounter for preprocedural laboratory examination: Secondary | ICD-10-CM | POA: Diagnosis not present

## 2015-03-21 HISTORY — DX: Unspecified cataract: H26.9

## 2015-03-21 HISTORY — DX: Nausea with vomiting, unspecified: R11.2

## 2015-03-21 HISTORY — DX: Essential (primary) hypertension: I10

## 2015-03-21 HISTORY — DX: Nausea with vomiting, unspecified: Z98.890

## 2015-03-21 LAB — CBC
HCT: 36.7 % (ref 36.0–46.0)
Hemoglobin: 12.4 g/dL (ref 12.0–15.0)
MCH: 30.1 pg (ref 26.0–34.0)
MCHC: 33.8 g/dL (ref 30.0–36.0)
MCV: 89.1 fL (ref 78.0–100.0)
Platelets: 189 10*3/uL (ref 150–400)
RBC: 4.12 MIL/uL (ref 3.87–5.11)
RDW: 13 % (ref 11.5–15.5)
WBC: 2.7 10*3/uL — ABNORMAL LOW (ref 4.0–10.5)

## 2015-03-21 LAB — BASIC METABOLIC PANEL
Anion gap: 8 (ref 5–15)
BUN: 9 mg/dL (ref 6–23)
CALCIUM: 9.2 mg/dL (ref 8.4–10.5)
CO2: 25 mmol/L (ref 19–32)
Chloride: 110 mmol/L (ref 96–112)
Creatinine, Ser: 0.67 mg/dL (ref 0.50–1.10)
GFR calc Af Amer: >90 mL/min (ref >90)
GFR calc non Af Amer: 90 mL/min — ABNORMAL LOW (ref >90)
GLUCOSE: 104 mg/dL — AB (ref 70–99)
Potassium: 4 mmol/L (ref 3.5–5.1)
SODIUM: 143 mmol/L (ref 135–145)

## 2015-03-23 MED ORDER — NEOMYCIN-POLYMYXIN-DEXAMETH 3.5-10000-0.1 OP SUSP
OPHTHALMIC | Status: AC
  Filled 2015-03-23: qty 5

## 2015-03-23 MED ORDER — LIDOCAINE HCL 3.5 % OP GEL
OPHTHALMIC | Status: AC
  Filled 2015-03-23: qty 1

## 2015-03-23 MED ORDER — CYCLOPENTOLATE-PHENYLEPHRINE OP SOLN OPTIME - NO CHARGE
OPHTHALMIC | Status: AC
  Filled 2015-03-23: qty 2

## 2015-03-23 MED ORDER — LIDOCAINE HCL (PF) 1 % IJ SOLN
INTRAMUSCULAR | Status: AC
Start: 2015-03-23 — End: 2015-03-23
  Filled 2015-03-23: qty 2

## 2015-03-23 MED ORDER — PHENYLEPHRINE HCL 2.5 % OP SOLN
OPHTHALMIC | Status: AC
  Filled 2015-03-23: qty 15

## 2015-03-23 MED ORDER — TETRACAINE HCL 0.5 % OP SOLN
OPHTHALMIC | Status: AC
  Filled 2015-03-23: qty 2

## 2015-03-26 ENCOUNTER — Encounter (HOSPITAL_COMMUNITY): Payer: Self-pay | Admitting: *Deleted

## 2015-03-26 ENCOUNTER — Ambulatory Visit (HOSPITAL_COMMUNITY): Payer: Medicare Other | Admitting: Anesthesiology

## 2015-03-26 ENCOUNTER — Encounter (HOSPITAL_COMMUNITY)
Admission: RE | Disposition: A | Discharge status: Home / Self Care | Payer: Self-pay | Source: Ambulatory Visit | Attending: Ophthalmology

## 2015-03-26 ENCOUNTER — Ambulatory Visit: Admit: 2015-03-26 | Payer: Self-pay | Admitting: Ophthalmology

## 2015-03-26 ENCOUNTER — Ambulatory Visit (HOSPITAL_COMMUNITY)
Admission: RE | Admit: 2015-03-26 | Discharge: 2015-03-26 | Disposition: A | Discharge status: Home / Self Care | Payer: Medicare Other | Source: Ambulatory Visit | Attending: Ophthalmology | Admitting: Ophthalmology

## 2015-03-26 DIAGNOSIS — H25811 Combined forms of age-related cataract, right eye: Secondary | ICD-10-CM | POA: Diagnosis not present

## 2015-03-26 DIAGNOSIS — H259 Unspecified age-related cataract: Secondary | ICD-10-CM | POA: Diagnosis not present

## 2015-03-26 HISTORY — PX: CATARACT EXTRACTION W/PHACO: SHX586

## 2015-03-26 SURGERY — PHACOEMULSIFICATION, CATARACT, WITH IOL INSERTION
Anesthesia: Monitor Anesthesia Care | Site: Eye | Laterality: Right

## 2015-03-26 MED ORDER — MEPERIDINE HCL 50 MG/ML IJ SOLN: 6.2500 mg | Freq: Once | INTRAMUSCULAR | Status: DC

## 2015-03-26 MED ORDER — PHENYLEPHRINE HCL 2.5 % OP SOLN
1.0000 [drp] | OPHTHALMIC | Status: AC
  Administered 2015-03-26 (×3): 1 [drp] via OPHTHALMIC

## 2015-03-26 MED ORDER — EPINEPHRINE HCL 1 MG/ML IJ SOLN
INTRAMUSCULAR | Status: AC
  Filled 2015-03-26: qty 1

## 2015-03-26 MED ORDER — POVIDONE-IODINE 5 % OP SOLN
OPHTHALMIC | Status: DC | PRN
  Administered 2015-03-26: 1 via OPHTHALMIC

## 2015-03-26 MED ORDER — NEOMYCIN-POLYMYXIN-DEXAMETH 3.5-10000-0.1 OP SUSP
OPHTHALMIC | Status: DC | PRN
  Administered 2015-03-26: 2 [drp] via OPHTHALMIC

## 2015-03-26 MED ORDER — FENTANYL CITRATE (PF) 100 MCG/2ML IJ SOLN
25.0000 ug | INTRAMUSCULAR | Status: AC
  Administered 2015-03-26 (×2): 25 ug via INTRAVENOUS

## 2015-03-26 MED ORDER — LIDOCAINE HCL 3.5 % OP GEL
1.0000 "application " | Freq: Once | OPHTHALMIC | Status: AC
  Administered 2015-03-26: 1 via OPHTHALMIC

## 2015-03-26 MED ORDER — LIDOCAINE HCL (PF) 1 % IJ SOLN
INTRAMUSCULAR | Status: DC | PRN
  Administered 2015-03-26: .5 mL

## 2015-03-26 MED ORDER — LACTATED RINGERS IV SOLN
INTRAVENOUS | Status: DC
  Administered 2015-03-26: 10:00:00 via INTRAVENOUS

## 2015-03-26 MED ORDER — FENTANYL CITRATE (PF) 100 MCG/2ML IJ SOLN
INTRAMUSCULAR | Status: AC
  Filled 2015-03-26: qty 2

## 2015-03-26 MED ORDER — ONDANSETRON HCL 4 MG/2ML IJ SOLN
4.0000 mg | Freq: Once | INTRAMUSCULAR | Status: AC
  Administered 2015-03-26: 4 mg via INTRAVENOUS

## 2015-03-26 MED ORDER — MIDAZOLAM HCL 2 MG/2ML IJ SOLN
1.0000 mg | INTRAMUSCULAR | Status: DC | PRN
  Administered 2015-03-26 (×2): 2 mg via INTRAVENOUS
  Filled 2015-03-26: qty 2

## 2015-03-26 MED ORDER — TETRACAINE HCL 0.5 % OP SOLN
1.0000 [drp] | OPHTHALMIC | Status: AC
  Administered 2015-03-26 (×3): 1 [drp] via OPHTHALMIC

## 2015-03-26 MED ORDER — ONDANSETRON HCL 4 MG/2ML IJ SOLN
INTRAMUSCULAR | Status: AC
  Filled 2015-03-26: qty 2

## 2015-03-26 MED ORDER — BSS IO SOLN
INTRAOCULAR | Status: DC | PRN
  Administered 2015-03-26: 15 mL

## 2015-03-26 MED ORDER — PROVISC 10 MG/ML IO SOLN
INTRAOCULAR | Status: DC | PRN
  Administered 2015-03-26: 0.85 mL via INTRAOCULAR

## 2015-03-26 MED ORDER — CYCLOPENTOLATE-PHENYLEPHRINE 0.2-1 % OP SOLN
1.0000 [drp] | OPHTHALMIC | Status: AC
  Administered 2015-03-26 (×3): 1 [drp] via OPHTHALMIC

## 2015-03-26 MED ORDER — MIDAZOLAM HCL 2 MG/2ML IJ SOLN
INTRAMUSCULAR | Status: AC
  Filled 2015-03-26: qty 2

## 2015-03-26 MED ORDER — EPINEPHRINE HCL 1 MG/ML IJ SOLN
INTRAOCULAR | Status: DC | PRN
  Administered 2015-03-26: 500 mL

## 2015-03-26 MED ORDER — ONDANSETRON HCL 4 MG/2ML IJ SOLN: 4.0000 mg | Freq: Once | INTRAMUSCULAR | Status: DC | PRN

## 2015-03-26 MED ORDER — FENTANYL CITRATE (PF) 100 MCG/2ML IJ SOLN: 25.0000 ug | INTRAMUSCULAR | Status: DC | PRN

## 2015-03-26 SURGICAL SUPPLY — 34 items
CAPSULAR TENSION RING-AMO (OPHTHALMIC RELATED) IMPLANT
CLOTH BEACON ORANGE TIMEOUT ST (SAFETY) ×2 IMPLANT
EYE SHIELD UNIVERSAL CLEAR (GAUZE/BANDAGES/DRESSINGS) ×2 IMPLANT
GLOVE BIO SURGEON STRL SZ 6.5 (GLOVE) IMPLANT
GLOVE BIO SURGEONS STRL SZ 6.5 (GLOVE)
GLOVE BIOGEL PI IND STRL 6.5 (GLOVE) IMPLANT
GLOVE BIOGEL PI IND STRL 7.0 (GLOVE) IMPLANT
GLOVE BIOGEL PI IND STRL 7.5 (GLOVE) IMPLANT
GLOVE BIOGEL PI INDICATOR 6.5 (GLOVE) ×2
GLOVE BIOGEL PI INDICATOR 7.0 (GLOVE)
GLOVE BIOGEL PI INDICATOR 7.5 (GLOVE)
GLOVE ECLIPSE 6.5 STRL STRAW (GLOVE) IMPLANT
GLOVE ECLIPSE 7.0 STRL STRAW (GLOVE) IMPLANT
GLOVE ECLIPSE 7.5 STRL STRAW (GLOVE) IMPLANT
GLOVE EXAM NITRILE LRG STRL (GLOVE) IMPLANT
GLOVE EXAM NITRILE MD LF STRL (GLOVE) ×2 IMPLANT
GLOVE SKINSENSE NS SZ6.5 (GLOVE)
GLOVE SKINSENSE NS SZ7.0 (GLOVE)
GLOVE SKINSENSE STRL SZ6.5 (GLOVE) IMPLANT
GLOVE SKINSENSE STRL SZ7.0 (GLOVE) IMPLANT
KIT VITRECTOMY (OPHTHALMIC RELATED) IMPLANT
PAD ARMBOARD 7.5X6 YLW CONV (MISCELLANEOUS) ×2 IMPLANT
PROC W NO LENS (INTRAOCULAR LENS)
PROC W SPEC LENS (INTRAOCULAR LENS)
PROCESS W NO LENS (INTRAOCULAR LENS) IMPLANT
PROCESS W SPEC LENS (INTRAOCULAR LENS) IMPLANT
RETRACTOR IRIS SIGHTPATH (OPHTHALMIC RELATED) IMPLANT
RING MALYGIN (MISCELLANEOUS) IMPLANT
SIGHTPATH CAT PROC W REG LENS (Ophthalmic Related) ×3 IMPLANT
SYRINGE LUER LOK 1CC (MISCELLANEOUS) ×2 IMPLANT
TAPE SURG TRANSPORE 1 IN (GAUZE/BANDAGES/DRESSINGS) IMPLANT
TAPE SURGICAL TRANSPORE 1 IN (GAUZE/BANDAGES/DRESSINGS) ×2
VISCOELASTIC ADDITIONAL (OPHTHALMIC RELATED) IMPLANT
WATER STERILE IRR 250ML POUR (IV SOLUTION) ×2 IMPLANT

## 2015-03-26 NOTE — Discharge Instructions (Signed)

## 2015-03-26 NOTE — Anesthesia Procedure Notes (Signed)
Procedure Name: MAC Date/Time: 03/26/2015 10:53 AM Performed by: Vista Deck Pre-anesthesia Checklist: Patient identified, Emergency Drugs available, Suction available, Timeout performed and Patient being monitored Patient Re-evaluated:Patient Re-evaluated prior to inductionOxygen Delivery Method: Nasal Cannula

## 2015-03-26 NOTE — H&P (Signed)
I have reviewed the H&P, the patient was re-examined, and I have identified no interval changes in medical condition and plan of care since the history and physical of record  

## 2015-03-26 NOTE — Transfer of Care (Signed)
Immediate Anesthesia Transfer of Care Note  Patient: Rachael Rios  Procedure(s) Performed: Procedure(s) with comments: CATARACT EXTRACTION PHACO AND INTRAOCULAR LENS PLACEMENT (IOC) (Right) - CDE 5.46  Patient Location: shortstay  Anesthesia Type:MAC  Level of Consciousness: awake  Airway & Oxygen Therapy: Patient Spontanous Breathing  Post-op Assessment: Report given to RN and Post -op Vital signs reviewed and stable  Post vital signs: Reviewed and stable  Last Vitals:  Filed Vitals:   03/26/15 1050  BP: 130/81  Temp:   Resp: 16    Complications: No apparent anesthesia complications

## 2015-03-26 NOTE — Anesthesia Preprocedure Evaluation (Signed)
Anesthesia Evaluation  Patient identified by MRN, date of birth, ID band Patient awake    Reviewed: Allergy & Precautions, NPO status , Patient's Chart, lab work & pertinent test results  History of Anesthesia Complications (+) PONV and history of anesthetic complications  Airway Mallampati: II  TM Distance: <3 FB     Dental  (+) Edentulous Upper, Edentulous Lower   Pulmonary neg pulmonary ROS,  breath sounds clear to auscultation        Cardiovascular hypertension, Pt. on medications Rhythm:Regular Rate:Normal     Neuro/Psych    GI/Hepatic negative GI ROS,   Endo/Other    Renal/GU      Musculoskeletal   Abdominal   Peds  Hematology   Anesthesia Other Findings   Reproductive/Obstetrics                             Anesthesia Physical Anesthesia Plan  ASA: II  Anesthesia Plan: MAC   Post-op Pain Management:    Induction: Intravenous  Airway Management Planned: Nasal Cannula  Additional Equipment:   Intra-op Plan:   Post-operative Plan:   Informed Consent: I have reviewed the patients History and Physical, chart, labs and discussed the procedure including the risks, benefits and alternatives for the proposed anesthesia with the patient or authorized representative who has indicated his/her understanding and acceptance.     Plan Discussed with:   Anesthesia Plan Comments:         Anesthesia Quick Evaluation

## 2015-03-26 NOTE — Anesthesia Postprocedure Evaluation (Signed)
  Anesthesia Post-op Note  Patient: Rachael Rios  Procedure(s) Performed: Procedure(s) (LRB): CATARACT EXTRACTION PHACO AND INTRAOCULAR LENS PLACEMENT (IOC) (Right)  Patient Location:  Short Stay  Anesthesia Type: MAC  Level of Consciousness: awake  Airway and Oxygen Therapy: Patient Spontanous Breathing  Post-op Pain: none  Post-op Assessment: Post-op Vital signs reviewed, Patient's Cardiovascular Status Stable, Respiratory Function Stable, Patent Airway, No signs of Nausea or vomiting and Pain level controlled  Post-op Vital Signs: Reviewed and stable  Complications: No apparent anesthesia complications

## 2015-03-26 NOTE — Op Note (Signed)
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  Assistants: None  Anesthesia: Topical with MAC  Indications: Painless, progressive loss of vision with compromise of daily activities.  Surgery: Cataract Extraction with Intraocular lens Implant Right Eye  Discription: The patient had dilating drops and viscous lidocaine placed into the Right eye in the pre-op holding area. After transfer to the operating room, a time out was performed. The patient was then prepped and draped. Beginning with a 6 degree blade a paracentesis port was made at the surgeon's 2 o'clock position. The anterior chamber was then filled with 1% non-preserved lidocaine. This was followed by filling the anterior chamber with Provisc.  A 2.29mm keratome blade was used to make a clear corneal incision at the temporal limbus.  A bent cystatome needle was used to create a continuous tear capsulotomy. Hydrodissection was performed with balanced salt solution on a Fine canula. The lens nucleus was then removed using the phacoemulsification handpiece. Residual cortex was removed with the I&A handpiece. The anterior chamber and capsular bag were refilled with Provisc. A posterior chamber intraocular lens was placed into the capsular bag with it's injector. The implant was positioned with the Kuglan hook. The Provisc was then removed from the anterior chamber and capsular bag with the I&A handpiece. Stromal hydration of the main incision and paracentesis port was performed with BSS on a Fine canula. The wounds were tested for leak which was negative. The patient tolerated the procedure well. There were no operative complications. The patient was then transferred to the recovery room in stable condition.  Complications: None  Specimen: None  EBL: None  Prosthetic device: Hoya iSert 250, power 20.5  D, SN [id number].

## 2015-03-27 ENCOUNTER — Encounter (HOSPITAL_COMMUNITY): Payer: Self-pay | Admitting: Ophthalmology

## 2015-04-16 DIAGNOSIS — H25812 Combined forms of age-related cataract, left eye: Secondary | ICD-10-CM | POA: Diagnosis not present

## 2015-04-16 DIAGNOSIS — Z961 Presence of intraocular lens: Secondary | ICD-10-CM | POA: Diagnosis not present

## 2015-04-24 ENCOUNTER — Encounter (HOSPITAL_COMMUNITY)
Admission: RE | Admit: 2015-04-24 | Discharge: 2015-04-24 | Disposition: A | Discharge status: Home / Self Care | Payer: Medicare Other | Source: Ambulatory Visit | Attending: Ophthalmology | Admitting: Ophthalmology

## 2015-04-25 ENCOUNTER — Encounter (HOSPITAL_COMMUNITY): Payer: Self-pay

## 2015-04-25 MED ORDER — NEOMYCIN-POLYMYXIN-DEXAMETH 3.5-10000-0.1 OP SUSP
OPHTHALMIC | Status: AC
Start: 2015-04-25 — End: 2015-04-25
  Filled 2015-04-25: qty 5

## 2015-04-25 MED ORDER — CYCLOPENTOLATE-PHENYLEPHRINE OP SOLN OPTIME - NO CHARGE
OPHTHALMIC | Status: AC
  Filled 2015-04-25: qty 2

## 2015-04-25 MED ORDER — LIDOCAINE HCL 3.5 % OP GEL
OPHTHALMIC | Status: AC
  Filled 2015-04-25: qty 1

## 2015-04-25 MED ORDER — TETRACAINE HCL 0.5 % OP SOLN
OPHTHALMIC | Status: AC
  Filled 2015-04-25: qty 2

## 2015-04-25 MED ORDER — PHENYLEPHRINE HCL 2.5 % OP SOLN
OPHTHALMIC | Status: AC
  Filled 2015-04-25: qty 15

## 2015-04-25 MED ORDER — LIDOCAINE HCL (PF) 1 % IJ SOLN
INTRAMUSCULAR | Status: AC
Start: 2015-04-25 — End: 2015-04-25
  Filled 2015-04-25: qty 2

## 2015-04-26 ENCOUNTER — Ambulatory Visit (HOSPITAL_COMMUNITY): Payer: Medicare Other | Admitting: Anesthesiology

## 2015-04-26 ENCOUNTER — Ambulatory Visit (HOSPITAL_COMMUNITY)
Admission: RE | Admit: 2015-04-26 | Discharge: 2015-04-26 | Disposition: A | Discharge status: Home / Self Care | Payer: Medicare Other | Source: Ambulatory Visit | Attending: Ophthalmology | Admitting: Ophthalmology

## 2015-04-26 ENCOUNTER — Encounter (HOSPITAL_COMMUNITY): Payer: Self-pay | Admitting: Ophthalmology

## 2015-04-26 ENCOUNTER — Encounter (HOSPITAL_COMMUNITY)
Admission: RE | Disposition: A | Discharge status: Home / Self Care | Payer: Self-pay | Source: Ambulatory Visit | Attending: Ophthalmology

## 2015-04-26 DIAGNOSIS — Z79899 Other long term (current) drug therapy: Secondary | ICD-10-CM | POA: Insufficient documentation

## 2015-04-26 DIAGNOSIS — H9190 Unspecified hearing loss, unspecified ear: Secondary | ICD-10-CM | POA: Diagnosis not present

## 2015-04-26 DIAGNOSIS — I10 Essential (primary) hypertension: Secondary | ICD-10-CM | POA: Diagnosis not present

## 2015-04-26 DIAGNOSIS — H25812 Combined forms of age-related cataract, left eye: Secondary | ICD-10-CM | POA: Diagnosis not present

## 2015-04-26 DIAGNOSIS — H269 Unspecified cataract: Secondary | ICD-10-CM | POA: Diagnosis present

## 2015-04-26 DIAGNOSIS — H259 Unspecified age-related cataract: Secondary | ICD-10-CM | POA: Diagnosis not present

## 2015-04-26 HISTORY — PX: CATARACT EXTRACTION W/PHACO: SHX586

## 2015-04-26 SURGERY — PHACOEMULSIFICATION, CATARACT, WITH IOL INSERTION
Anesthesia: Monitor Anesthesia Care | Site: Eye | Laterality: Left

## 2015-04-26 MED ORDER — LIDOCAINE HCL 3.5 % OP GEL
1.0000 "application " | Freq: Once | OPHTHALMIC | Status: AC
  Administered 2015-04-26: 1 via OPHTHALMIC

## 2015-04-26 MED ORDER — LACTATED RINGERS IV SOLN
INTRAVENOUS | Status: DC
  Administered 2015-04-26: 75 mL/h via INTRAVENOUS

## 2015-04-26 MED ORDER — EPINEPHRINE HCL 1 MG/ML IJ SOLN
INTRAOCULAR | Status: DC | PRN
  Administered 2015-04-26: 500 mL

## 2015-04-26 MED ORDER — EPINEPHRINE HCL 1 MG/ML IJ SOLN
INTRAMUSCULAR | Status: AC
  Filled 2015-04-26: qty 1

## 2015-04-26 MED ORDER — BSS IO SOLN
INTRAOCULAR | Status: DC | PRN
  Administered 2015-04-26: 15 mL

## 2015-04-26 MED ORDER — FENTANYL CITRATE (PF) 100 MCG/2ML IJ SOLN
25.0000 ug | INTRAMUSCULAR | Status: AC
  Administered 2015-04-26 (×2): 25 ug via INTRAVENOUS

## 2015-04-26 MED ORDER — MIDAZOLAM HCL 2 MG/2ML IJ SOLN
INTRAMUSCULAR | Status: AC
  Filled 2015-04-26: qty 2

## 2015-04-26 MED ORDER — TETRACAINE HCL 0.5 % OP SOLN
1.0000 [drp] | OPHTHALMIC | Status: AC
  Administered 2015-04-26 (×3): 1 [drp] via OPHTHALMIC

## 2015-04-26 MED ORDER — POVIDONE-IODINE 5 % OP SOLN
OPHTHALMIC | Status: DC | PRN
  Administered 2015-04-26: 1 via OPHTHALMIC

## 2015-04-26 MED ORDER — PROVISC 10 MG/ML IO SOLN
INTRAOCULAR | Status: DC | PRN
  Administered 2015-04-26: 0.85 mL via INTRAOCULAR

## 2015-04-26 MED ORDER — CYCLOPENTOLATE-PHENYLEPHRINE 0.2-1 % OP SOLN
1.0000 [drp] | OPHTHALMIC | Status: AC
  Administered 2015-04-26 (×3): 1 [drp] via OPHTHALMIC

## 2015-04-26 MED ORDER — LIDOCAINE HCL (PF) 1 % IJ SOLN
INTRAMUSCULAR | Status: DC | PRN
  Administered 2015-04-26: .5 mL

## 2015-04-26 MED ORDER — PHENYLEPHRINE HCL 2.5 % OP SOLN
1.0000 [drp] | OPHTHALMIC | Status: AC
  Administered 2015-04-26 (×3): 1 [drp] via OPHTHALMIC

## 2015-04-26 MED ORDER — MIDAZOLAM HCL 2 MG/2ML IJ SOLN
1.0000 mg | INTRAMUSCULAR | Status: DC | PRN
  Administered 2015-04-26: 2 mg via INTRAVENOUS

## 2015-04-26 MED ORDER — FENTANYL CITRATE (PF) 100 MCG/2ML IJ SOLN
INTRAMUSCULAR | Status: AC
  Filled 2015-04-26: qty 2

## 2015-04-26 MED ORDER — NEOMYCIN-POLYMYXIN-DEXAMETH 3.5-10000-0.1 OP SUSP
OPHTHALMIC | Status: DC | PRN
Start: 2015-04-26 — End: 2015-04-26
  Administered 2015-04-26: 2 [drp] via OPHTHALMIC

## 2015-04-26 SURGICAL SUPPLY — 34 items
CAPSULAR TENSION RING-AMO (OPHTHALMIC RELATED) IMPLANT
CLOTH BEACON ORANGE TIMEOUT ST (SAFETY) ×2 IMPLANT
EYE SHIELD UNIVERSAL CLEAR (GAUZE/BANDAGES/DRESSINGS) ×2 IMPLANT
GLOVE BIO SURGEON STRL SZ 6.5 (GLOVE) IMPLANT
GLOVE BIO SURGEONS STRL SZ 6.5 (GLOVE)
GLOVE BIOGEL PI IND STRL 6.5 (GLOVE) IMPLANT
GLOVE BIOGEL PI IND STRL 7.0 (GLOVE) IMPLANT
GLOVE BIOGEL PI IND STRL 7.5 (GLOVE) IMPLANT
GLOVE BIOGEL PI INDICATOR 6.5 (GLOVE)
GLOVE BIOGEL PI INDICATOR 7.0 (GLOVE) ×2
GLOVE BIOGEL PI INDICATOR 7.5 (GLOVE)
GLOVE ECLIPSE 6.5 STRL STRAW (GLOVE) IMPLANT
GLOVE ECLIPSE 7.0 STRL STRAW (GLOVE) IMPLANT
GLOVE ECLIPSE 7.5 STRL STRAW (GLOVE) IMPLANT
GLOVE EXAM NITRILE LRG STRL (GLOVE) ×2 IMPLANT
GLOVE EXAM NITRILE MD LF STRL (GLOVE) IMPLANT
GLOVE SKINSENSE NS SZ6.5 (GLOVE)
GLOVE SKINSENSE NS SZ7.0 (GLOVE)
GLOVE SKINSENSE STRL SZ6.5 (GLOVE) IMPLANT
GLOVE SKINSENSE STRL SZ7.0 (GLOVE) IMPLANT
KIT VITRECTOMY (OPHTHALMIC RELATED) IMPLANT
PAD ARMBOARD 7.5X6 YLW CONV (MISCELLANEOUS) ×2 IMPLANT
PROC W NO LENS (INTRAOCULAR LENS)
PROC W SPEC LENS (INTRAOCULAR LENS)
PROCESS W NO LENS (INTRAOCULAR LENS) IMPLANT
PROCESS W SPEC LENS (INTRAOCULAR LENS) IMPLANT
RETRACTOR IRIS SIGHTPATH (OPHTHALMIC RELATED) IMPLANT
RING MALYGIN (MISCELLANEOUS) IMPLANT
SIGHTPATH CAT PROC W REG LENS (Ophthalmic Related) ×3 IMPLANT
SYRINGE LUER LOK 1CC (MISCELLANEOUS) ×2 IMPLANT
TAPE SURG TRANSPORE 1 IN (GAUZE/BANDAGES/DRESSINGS) IMPLANT
TAPE SURGICAL TRANSPORE 1 IN (GAUZE/BANDAGES/DRESSINGS) ×2
VISCOELASTIC ADDITIONAL (OPHTHALMIC RELATED) IMPLANT
WATER STERILE IRR 250ML POUR (IV SOLUTION) ×2 IMPLANT

## 2015-04-26 NOTE — H&P (Signed)
I have reviewed the H&P, the patient was re-examined, and I have identified no interval changes in medical condition and plan of care since the history and physical of record  

## 2015-04-26 NOTE — Transfer of Care (Signed)
Immediate Anesthesia Transfer of Care Note  Patient: Rachael Rios  Procedure(s) Performed: Procedure(s) with comments: CATARACT EXTRACTION PHACO AND INTRAOCULAR LENS PLACEMENT (IOC) (Left) - CDE 6.29  Patient Location: Short Stay  Anesthesia Type:MAC  Level of Consciousness: awake, alert , oriented and patient cooperative  Airway & Oxygen Therapy: Patient Spontanous Breathing  Post-op Assessment: Report given to RN and Post -op Vital signs reviewed and stable  Post vital signs: Reviewed and stable  Last Vitals:  Filed Vitals:   04/26/15 1103  BP: 113/70  Temp:   Resp: 16    Complications: No apparent anesthesia complications

## 2015-04-26 NOTE — Discharge Instructions (Signed)

## 2015-04-26 NOTE — Anesthesia Procedure Notes (Signed)
Procedure Name: MAC Date/Time: 04/26/2015 11:08 AM Performed by: Andree Elk, Alona Danford A Pre-anesthesia Checklist: Patient identified, Timeout performed, Emergency Drugs available, Suction available and Patient being monitored Oxygen Delivery Method: Nasal cannula

## 2015-04-26 NOTE — Op Note (Signed)
Date of Admission: 04/26/2015  Date of Surgery: 04/26/2015   Pre-Op Dx: Cataract Left Eye  Post-Op Dx: Senile Combined Cataract Left  Eye,  Dx Code Y38.887  Surgeon: Tonny Branch, M.D.  Assistants: None  Anesthesia: Topical with MAC  Indications: Painless, progressive loss of vision with compromise of daily activities.  Surgery: Cataract Extraction with Intraocular lens Implant Left Eye  Discription: The patient had dilating drops and viscous lidocaine placed into the Left eye in the pre-op holding area. After transfer to the operating room, a time out was performed. The patient was then prepped and draped. Beginning with a 30 degree blade a paracentesis port was made at the surgeon's 2 o'clock position. The anterior chamber was then filled with 1% non-preserved lidocaine. This was followed by filling the anterior chamber with Provisc.  A 2.63mm keratome blade was used to make a clear corneal incision at the temporal limbus.  A bent cystatome needle was used to create a continuous tear capsulotomy. Hydrodissection was performed with balanced salt solution on a Fine canula. The lens nucleus was then removed using the phacoemulsification handpiece. Residual cortex was removed with the I&A handpiece. The anterior chamber and capsular bag were refilled with Provisc. A posterior chamber intraocular lens was placed into the capsular bag with it's injector. The implant was positioned with the Kuglan hook. The Provisc was then removed from the anterior chamber and capsular bag with the I&A handpiece. Stromal hydration of the main incision and paracentesis port was performed with BSS on a Fine canula. The wounds were tested for leak which was negative. The patient tolerated the procedure well. There were no operative complications. The patient was then transferred to the recovery room in stable condition.  Complications: None  Specimen: None  EBL: None  Prosthetic device: Hoya iSert 250, power 21.0 D, SN  [id number].

## 2015-04-26 NOTE — Anesthesia Postprocedure Evaluation (Signed)
  Anesthesia Post-op Note  Patient: Rachael Rios  Procedure(s) Performed: Procedure(s) with comments: CATARACT EXTRACTION PHACO AND INTRAOCULAR LENS PLACEMENT (IOC) (Left) - CDE 6.29  Patient Location: Short Stay  Anesthesia Type:MAC  Level of Consciousness: awake, alert , oriented and patient cooperative  Airway and Oxygen Therapy: Patient Spontanous Breathing  Post-op Pain: none  Post-op Assessment: Post-op Vital signs reviewed, Patient's Cardiovascular Status Stable, Respiratory Function Stable, Patent Airway, No signs of Nausea or vomiting and Pain level controlled  Post-op Vital Signs: Reviewed and stable  Last Vitals:  Filed Vitals:   04/26/15 1103  BP: 113/70  Temp:   Resp: 16    Complications: No apparent anesthesia complications

## 2015-04-26 NOTE — Anesthesia Preprocedure Evaluation (Signed)
Anesthesia Evaluation  Patient identified by MRN, date of birth, ID band Patient awake    Reviewed: Allergy & Precautions, NPO status , Patient's Chart, lab work & pertinent test results  History of Anesthesia Complications (+) PONV and history of anesthetic complications  Airway Mallampati: II  TM Distance: <3 FB     Dental  (+) Edentulous Upper, Edentulous Lower   Pulmonary neg pulmonary ROS,  breath sounds clear to auscultation        Cardiovascular hypertension, Pt. on medications Rhythm:Regular Rate:Normal     Neuro/Psych    GI/Hepatic negative GI ROS,   Endo/Other    Renal/GU      Musculoskeletal   Abdominal   Peds  Hematology   Anesthesia Other Findings   Reproductive/Obstetrics                             Anesthesia Physical Anesthesia Plan  ASA: II  Anesthesia Plan: MAC   Post-op Pain Management:    Induction: Intravenous  Airway Management Planned: Nasal Cannula  Additional Equipment:   Intra-op Plan:   Post-operative Plan:   Informed Consent: I have reviewed the patients History and Physical, chart, labs and discussed the procedure including the risks, benefits and alternatives for the proposed anesthesia with the patient or authorized representative who has indicated his/her understanding and acceptance.     Plan Discussed with:   Anesthesia Plan Comments:         Anesthesia Quick Evaluation

## 2015-04-27 ENCOUNTER — Encounter (HOSPITAL_COMMUNITY): Payer: Self-pay | Admitting: Ophthalmology

## 2015-06-18 DIAGNOSIS — J069 Acute upper respiratory infection, unspecified: Secondary | ICD-10-CM | POA: Diagnosis not present

## 2015-06-18 DIAGNOSIS — Z6820 Body mass index (BMI) 20.0-20.9, adult: Secondary | ICD-10-CM | POA: Diagnosis not present

## 2015-06-18 DIAGNOSIS — Z1389 Encounter for screening for other disorder: Secondary | ICD-10-CM | POA: Diagnosis not present

## 2015-08-13 DIAGNOSIS — I10 Essential (primary) hypertension: Secondary | ICD-10-CM | POA: Diagnosis not present

## 2015-09-21 DIAGNOSIS — I10 Essential (primary) hypertension: Secondary | ICD-10-CM | POA: Diagnosis not present

## 2015-09-21 DIAGNOSIS — M47816 Spondylosis without myelopathy or radiculopathy, lumbar region: Secondary | ICD-10-CM | POA: Diagnosis not present

## 2015-09-21 DIAGNOSIS — Z6820 Body mass index (BMI) 20.0-20.9, adult: Secondary | ICD-10-CM | POA: Diagnosis not present

## 2015-09-21 DIAGNOSIS — M545 Low back pain: Secondary | ICD-10-CM | POA: Diagnosis not present

## 2015-09-21 DIAGNOSIS — Z1389 Encounter for screening for other disorder: Secondary | ICD-10-CM | POA: Diagnosis not present

## 2015-09-26 ENCOUNTER — Other Ambulatory Visit (HOSPITAL_COMMUNITY): Payer: Self-pay | Admitting: Internal Medicine

## 2015-09-26 DIAGNOSIS — M47816 Spondylosis without myelopathy or radiculopathy, lumbar region: Secondary | ICD-10-CM

## 2015-10-01 ENCOUNTER — Ambulatory Visit (HOSPITAL_COMMUNITY)
Admission: RE | Admit: 2015-10-01 | Discharge: 2015-10-01 | Disposition: A | Discharge status: Home / Self Care | Payer: Medicare Other | Source: Ambulatory Visit | Attending: Internal Medicine | Admitting: Internal Medicine

## 2015-10-01 DIAGNOSIS — M47816 Spondylosis without myelopathy or radiculopathy, lumbar region: Secondary | ICD-10-CM | POA: Diagnosis not present

## 2015-10-01 DIAGNOSIS — Z780 Asymptomatic menopausal state: Secondary | ICD-10-CM | POA: Diagnosis not present

## 2015-10-01 DIAGNOSIS — M810 Age-related osteoporosis without current pathological fracture: Secondary | ICD-10-CM | POA: Insufficient documentation

## 2015-11-05 DIAGNOSIS — X32XXXD Exposure to sunlight, subsequent encounter: Secondary | ICD-10-CM | POA: Diagnosis not present

## 2015-11-05 DIAGNOSIS — L570 Actinic keratosis: Secondary | ICD-10-CM | POA: Diagnosis not present

## 2015-11-05 DIAGNOSIS — D225 Melanocytic nevi of trunk: Secondary | ICD-10-CM | POA: Diagnosis not present

## 2015-11-06 ENCOUNTER — Telehealth: Payer: Self-pay

## 2015-11-06 NOTE — Telephone Encounter (Signed)
Patient called asking for DS. I told her DS was on another call and she would have to call her back. She said that DS had been trying to reach her and could call [phone]

## 2015-11-07 NOTE — Telephone Encounter (Signed)
Pt was sent a letter on recall on 08/30/2014 to call Braelyn Jenson. I have just returned her call from yesterday and LMOM that I will try to call her again this afternoon.

## 2015-11-08 ENCOUNTER — Telehealth: Payer: Self-pay

## 2015-11-09 NOTE — Telephone Encounter (Signed)
See triage dated 11/08/2015.

## 2015-11-13 NOTE — Telephone Encounter (Signed)
Ok to schedule.

## 2015-11-13 NOTE — Telephone Encounter (Signed)
Gastroenterology Pre-Procedure Review  Request Date: 11/08/2015 Requesting Physician: Dr. [name]  PATIENT REVIEW QUESTIONS: The patient responded to the following health history questions as indicated:    PT's last colonoscopy was 09/03/2004 by Dr. Gala Romney   1. Diabetes Melitis: no 2. Joint replacements in the past 12 months: no 3. Major health problems in the past 3 months: no 4. Has an artificial valve or MVP: no 5. Has a defibrillator: no 6. Has been advised in past to take antibiotics in advance of a procedure like teeth cleaning: no 7. Family history of colon cancer: no  8. Alcohol Use: no 9. History of sleep apnea: no     MEDICATIONS & ALLERGIES:    Patient reports the following regarding taking any blood thinners:   Plavix? no Aspirin? no Coumadin? no  Patient confirms/reports the following medications:  Current Outpatient Prescriptions  Medication Sig Dispense Refill  . alendronate (FOSAMAX) 70 MG tablet Take 70 mg by mouth once a week. Take with a full glass of water on an empty stomach.    . calcium carbonate (OSCAL) 1500 (600 CA) MG TABS tablet Take by mouth 2 (two) times daily with a meal.    . fish oil-omega-3 fatty acids 1000 MG capsule Take 1 g by mouth daily.     Marland Kitchen losartan (COZAAR) 25 MG tablet Take 25 mg by mouth daily.    . Multiple Vitamins-Minerals (CENTRUM SILVER PO) Take 1 tablet by mouth daily.     . Acetaminophen-Codeine (TYLENOL/CODEINE #3) 300-30 MG per tablet Take 1 tablet by mouth every 4 (four) hours as needed for pain. (Patient not taking: Reported on 02/22/2015) 40 tablet 5   No current facility-administered medications for this visit.    Patient confirms/reports the following allergies:  No Known Allergies  No orders of the defined types were placed in this encounter.    AUTHORIZATION INFORMATION Primary Insurance:   ID #:  Group #:  Pre-Cert / Auth required: Pre-Cert / Auth #:   Secondary Insurance:   ID #:  Group #:  Pre-Cert / Auth  required: Pre-Cert / Auth #:   SCHEDULE INFORMATION: Procedure has been scheduled as follows:  Date:  12/07/2015                 Time: 10:30 AM  Location: [facility] Short Stay  This Gastroenterology Pre-Precedure Review Form is being routed to the following provider(s): R. Garfield Cornea, MD

## 2015-11-14 ENCOUNTER — Encounter (INDEPENDENT_AMBULATORY_CARE_PROVIDER_SITE_OTHER): Payer: Self-pay | Admitting: *Deleted

## 2015-11-15 ENCOUNTER — Other Ambulatory Visit: Payer: Self-pay

## 2015-11-15 DIAGNOSIS — Z1211 Encounter for screening for malignant neoplasm of colon: Secondary | ICD-10-CM

## 2015-11-15 MED ORDER — PEG 3350-KCL-NA BICARB-NACL 420 G PO SOLR: 4000.0000 mL | ORAL | Status: DC

## 2015-11-15 NOTE — Telephone Encounter (Signed)
Rx sent to the pharmacy and instructions mailed to pt.  

## 2015-12-07 ENCOUNTER — Encounter (HOSPITAL_COMMUNITY): Payer: Self-pay | Admitting: *Deleted

## 2015-12-07 ENCOUNTER — Encounter (HOSPITAL_COMMUNITY)
Admission: RE | Disposition: A | Discharge status: Home / Self Care | Payer: Self-pay | Source: Ambulatory Visit | Attending: Internal Medicine

## 2015-12-07 ENCOUNTER — Ambulatory Visit (HOSPITAL_COMMUNITY)
Admission: RE | Admit: 2015-12-07 | Discharge: 2015-12-07 | Disposition: A | Discharge status: Home / Self Care | Payer: Medicare Other | Source: Ambulatory Visit | Attending: Internal Medicine | Admitting: Internal Medicine

## 2015-12-07 DIAGNOSIS — K642 Third degree hemorrhoids: Secondary | ICD-10-CM | POA: Diagnosis not present

## 2015-12-07 DIAGNOSIS — Z1211 Encounter for screening for malignant neoplasm of colon: Secondary | ICD-10-CM | POA: Diagnosis not present

## 2015-12-07 DIAGNOSIS — I10 Essential (primary) hypertension: Secondary | ICD-10-CM | POA: Insufficient documentation

## 2015-12-07 DIAGNOSIS — Z79899 Other long term (current) drug therapy: Secondary | ICD-10-CM | POA: Insufficient documentation

## 2015-12-07 HISTORY — PX: COLONOSCOPY: SHX5424

## 2015-12-07 SURGERY — COLONOSCOPY
Anesthesia: Moderate Sedation

## 2015-12-07 MED ORDER — STERILE WATER FOR IRRIGATION IR SOLN
Status: DC | PRN
  Administered 2015-12-07: 09:00:00

## 2015-12-07 MED ORDER — MEPERIDINE HCL 100 MG/ML IJ SOLN
INTRAMUSCULAR | Status: DC
Start: 2015-12-07 — End: 2015-12-07
  Filled 2015-12-07: qty 2

## 2015-12-07 MED ORDER — MIDAZOLAM HCL 5 MG/5ML IJ SOLN
INTRAMUSCULAR | Status: DC | PRN
  Administered 2015-12-07 (×2): 1 mg via INTRAVENOUS
  Administered 2015-12-07: 2 mg via INTRAVENOUS

## 2015-12-07 MED ORDER — ONDANSETRON HCL 4 MG/2ML IJ SOLN
INTRAMUSCULAR | Status: DC | PRN
  Administered 2015-12-07: 4 mg via INTRAVENOUS

## 2015-12-07 MED ORDER — ONDANSETRON HCL 4 MG/2ML IJ SOLN
INTRAMUSCULAR | Status: AC
  Filled 2015-12-07: qty 2

## 2015-12-07 MED ORDER — MIDAZOLAM HCL 5 MG/5ML IJ SOLN
INTRAMUSCULAR | Status: DC
Start: 2015-12-07 — End: 2015-12-07
  Filled 2015-12-07: qty 10

## 2015-12-07 MED ORDER — MEPERIDINE HCL 100 MG/ML IJ SOLN
INTRAMUSCULAR | Status: DC | PRN
  Administered 2015-12-07: 50 mg via INTRAVENOUS

## 2015-12-07 MED ORDER — SODIUM CHLORIDE 0.9 % IV SOLN
INTRAVENOUS | Status: DC
  Administered 2015-12-07: 08:00:00 via INTRAVENOUS

## 2015-12-07 NOTE — Discharge Instructions (Signed)
Colonoscopy Discharge Instructions  Read the instructions outlined below and refer to this sheet in the next few weeks. These discharge instructions provide you with general information on caring for yourself after you leave the hospital. Your doctor may also give you specific instructions. While your treatment has been planned according to the most current medical practices available, unavoidable complications occasionally occur. If you have any problems or questions after discharge, call Dr. Gala Romney at [phone]. ACTIVITY  You may resume your regular activity, but move at a slower pace for the next 24 hours.   Take frequent rest periods for the next 24 hours.   Walking will help get rid of the air and reduce the bloated feeling in your belly (abdomen).   No driving for 24 hours (because of the medicine (anesthesia) used during the test).    Do not sign any important legal documents or operate any machinery for 24 hours (because of the anesthesia used during the test).  NUTRITION  Drink plenty of fluids.   You may resume your normal diet as instructed by your doctor.   Begin with a light meal and progress to your normal diet. Heavy or fried foods are harder to digest and may make you feel sick to your stomach (nauseated).   Avoid alcoholic beverages for 24 hours or as instructed.  MEDICATIONS  You may resume your normal medications unless your doctor tells you otherwise.  WHAT YOU CAN EXPECT TODAY  Some feelings of bloating in the abdomen.   Passage of more gas than usual.   Spotting of blood in your stool or on the toilet paper.  IF YOU HAD POLYPS REMOVED DURING THE COLONOSCOPY:  No aspirin products for 7 days or as instructed.   No alcohol for 7 days or as instructed.   Eat a soft diet for the next 24 hours.  FINDING OUT THE RESULTS OF YOUR TEST Not all test results are available during your visit. If your test results are not back during the visit, make an appointment  with your caregiver to find out the results. Do not assume everything is normal if you have not heard from your caregiver or the medical facility. It is important for you to follow up on all of your test results.  SEEK IMMEDIATE MEDICAL ATTENTION IF:  You have more than a spotting of blood in your stool.   Your belly is swollen (abdominal distention).   You are nauseated or vomiting.   You have a temperature over 101.   You have abdominal pain or discomfort that is severe or gets worse throughout the day.    Hemorrhoid information provided  Recommend repeat screening colonoscopy in 10 years    Hemorrhoids Hemorrhoids are swollen veins around the rectum or anus. There are two types of hemorrhoids:   Internal hemorrhoids. These occur in the veins just inside the rectum. They may poke through to the outside and become irritated and painful.  External hemorrhoids. These occur in the veins outside the anus and can be felt as a painful swelling or hard lump near the anus. CAUSES  Pregnancy.   Obesity.   Constipation or diarrhea.   Straining to have a bowel movement.   Sitting for long periods on the toilet.  Heavy lifting or other activity that caused you to strain.  Anal intercourse. SYMPTOMS   Pain.   Anal itching or irritation.   Rectal bleeding.   Fecal leakage.   Anal swelling.   One or more lumps  around the anus.  DIAGNOSIS  Your caregiver may be able to diagnose hemorrhoids by visual examination. Other examinations or tests that may be performed include:   Examination of the rectal area with a gloved hand (digital rectal exam).   Examination of anal canal using a small tube (scope).   A blood test if you have lost a significant amount of blood.  A test to look inside the colon (sigmoidoscopy or colonoscopy). TREATMENT Most hemorrhoids can be treated at home. However, if symptoms do not seem to be getting better or if you have a lot of  rectal bleeding, your caregiver may perform a procedure to help make the hemorrhoids get smaller or remove them completely. Possible treatments include:   Placing a rubber band at the base of the hemorrhoid to cut off the circulation (rubber band ligation).   Injecting a chemical to shrink the hemorrhoid (sclerotherapy).   Using a tool to burn the hemorrhoid (infrared light therapy).   Surgically removing the hemorrhoid (hemorrhoidectomy).   Stapling the hemorrhoid to block blood flow to the tissue (hemorrhoid stapling).  HOME CARE INSTRUCTIONS   Eat foods with fiber, such as whole grains, beans, nuts, fruits, and vegetables. Ask your doctor about taking products with added fiber in them (fibersupplements).  Increase fluid intake. Drink enough water and fluids to keep your urine clear or pale yellow.   Exercise regularly.   Go to the bathroom when you have the urge to have a bowel movement. Do not wait.   Avoid straining to have bowel movements.   Keep the anal area dry and clean. Use wet toilet paper or moist towelettes after a bowel movement.   Medicated creams and suppositories may be used or applied as directed.   Only take over-the-counter or prescription medicines as directed by your caregiver.   Take warm sitz baths for 15-20 minutes, 3-4 times a day to ease pain and discomfort.   Place ice packs on the hemorrhoids if they are tender and swollen. Using ice packs between sitz baths may be helpful.   Put ice in a plastic bag.   Place a towel between your skin and the bag.   Leave the ice on for 15-20 minutes, 3-4 times a day.   Do not use a donut-shaped pillow or sit on the toilet for long periods. This increases blood pooling and pain.  SEEK MEDICAL CARE IF:  You have increasing pain and swelling that is not controlled by treatment or medicine.  You have uncontrolled bleeding.  You have difficulty or you are unable to have a bowel  movement.  You have pain or inflammation outside the area of the hemorrhoids. MAKE SURE YOU:  Understand these instructions.  Will watch your condition.  Will get help right away if you are not doing well or get worse.   This information is not intended to replace advice given to you by your health care provider. Make sure you discuss any questions you have with your health care provider.   Document Released: 11/14/2000 Document Revised: 11/03/2012 Document Reviewed: 09/21/2012 Elsevier Interactive Patient Education [employer].

## 2015-12-07 NOTE — H&P (Signed)
@LOGO @   Primary Care Physician:  Glo Herring., MD Primary Gastroenterologist:  Dr. Gala Romney  Pre-Procedure History & Physical: HPI:  Rachael Rios is a 65 y.o. female is here for a screening colonoscopy. Negative colonoscopy 2005. No bowel symptoms. No family history of colon cancer  Past Medical History  Diagnosis Date  . PONV (postoperative nausea and vomiting)   . Hypertension   . Cataract     Past Surgical History  Procedure Laterality Date  . Left hand    . Hand surgery    . Finger surgery Left 2000    Left middle finger  . Inner ear surgery Bilateral 1977  . Cataract extraction w/phaco Right 03/26/2015    Procedure: CATARACT EXTRACTION PHACO AND INTRAOCULAR LENS PLACEMENT (IOC);  Surgeon: Tonny Branch, MD;  Location: [facility];  Service: Ophthalmology;  Laterality: Right;  CDE 5.46  . Cataract extraction w/phaco Left 04/26/2015    Procedure: CATARACT EXTRACTION PHACO AND INTRAOCULAR LENS PLACEMENT (IOC);  Surgeon: Tonny Branch, MD;  Location: [facility];  Service: Ophthalmology;  Laterality: Left;  CDE 6.29    Prior to Admission medications   Medication Sig Start Date End Date Taking? Authorizing Provider  alendronate (FOSAMAX) 70 MG tablet Take 70 mg by mouth once a week. Take with a full glass of water on an empty stomach. Takes on Sundays.   Yes Historical Provider, MD  calcium carbonate (OSCAL) 1500 (600 CA) MG TABS tablet Take 1,500 mg by mouth 2 (two) times daily with a meal.    Yes Historical Provider, MD  fish oil-omega-3 fatty acids 1000 MG capsule Take 1 g by mouth daily.    Yes Historical Provider, MD  losartan (COZAAR) 25 MG tablet Take 25 mg by mouth daily.   Yes Historical Provider, MD  Multiple Vitamins-Minerals (CENTRUM SILVER PO) Take 1 tablet by mouth daily.    Yes Historical Provider, MD  polyethylene glycol-electrolytes (TRILYTE) 420 G solution Take 4,000 mLs by mouth as directed. 11/15/15  Yes Daneil Dolin, MD    Allergies as of 11/15/2015  . (No Known  Allergies)    Family History  Problem Relation Age of Onset  . Diabetes      Social History   Social History  . Marital Status: Widowed    Spouse Name: N/A  . Number of Children: N/A  . Years of Education: 12th grade   Occupational History  .     Social History Main Topics  . Smoking status: Never Smoker   . Smokeless tobacco: Not on file  . Alcohol Use: No  . Drug Use: No  . Sexual Activity: Not on file   Other Topics Concern  . Not on file   Social History Narrative    Review of Systems: See HPI, otherwise negative ROS  Physical Exam: BP 136/83 mmHg  Pulse 123  Temp(Src) 97.9 F (36.6 C) (Oral)  Resp 16  Ht 5\' 5"  (1.651 m)  Wt 118 lb (53.524 kg)  BMI 19.64 kg/m2  SpO2 98% General:   Alert,  Well-developed, well-nourished, pleasant and cooperative in NAD Head:  Normocephalic and atraumatic. Eyes:  Sclera clear, no icterus.   Conjunctiva pink. ELungs:  Clear throughout to auscultation.   No wheezes, crackles, or rhonchi. No acute distress. Heart:  Regular rate and rhythm; no murmurs, clicks, rubs,  or gallops. Abdomen:  Soft, nontender and nondistended. No masses, hepatosplenomegaly or hernias noted. Normal bowel sounds, without guarding, and without rebound.   Extremities:  Without clubbing or  edema.  Impression/Plan: Rachael Rios is now here to undergo a screening colonoscopy.  Average risk screening examination  Risks, benefits, limitations, imponderables and alternatives regarding colonoscopy have been reviewed with the patient. Questions have been answered. All parties agreeable.     Notice:  This dictation was prepared with Dragon dictation along with smaller phrase technology. Any transcriptional errors that result from this process are unintentional and may not be corrected upon review.

## 2015-12-07 NOTE — Op Note (Signed)
[facility] [address] [place], [address]   COLONOSCOPY PROCEDURE REPORT  PATIENT: Rachael, Rios  MR#: [id number] BIRTHDATE: 1951-02-17 , 61  yrs. old GENDER: female ENDOSCOPIST: R.  Garfield Cornea, MD FACP [facility] REFERRED [id number] [name], M.D. PROCEDURE DATE:  January 04, 2016 PROCEDURE:   Colonoscopy, screening INDICATIONS:Average risk colorectal cancer screening examination. MEDICATIONS: Versed 4 mg IV and Demerol 50 mg IV in divided doses. Zofran 4 mg IV. ASA CLASS:       Class II  CONSENT: The risks, benefits, alternatives and imponderables including but not limited to bleeding, perforation as well as the possibility of a missed lesion have been reviewed.  The potential for biopsy, lesion removal, etc. have also been discussed. Questions have been answered.  All parties agreeable.  Please see the history and physical in the medical record for more information.  DESCRIPTION OF PROCEDURE:   After the risks benefits and alternatives of the procedure were thoroughly explained, informed consent was obtained.  The digital rectal exam revealed no abnormalities of the rectum. Grade 3 hemorrhoids present..   The [id number]  endoscope was introduced through the anus and advanced to the cecum, which was identified by both the appendix and ileocecal valve. No adverse events experienced.   The quality of the prep was adequate  The instrument was then slowly withdrawn as the colon was fully examined. Estimated blood loss is zero unless otherwise noted in this procedure report.      COLON FINDINGS: Patient had grade 3 hemorrhoids.  Normal-appearing rectal mucosa.  Normal-appearing colonic mucosa.  Retroflexion was performed. .  Withdrawal time=7 minutes 0 seconds.  sedation start time 08 28; sedation end time 08 53 The scope was withdrawn and the procedure completed. COMPLICATIONS: There were no immediate complications.  ENDOSCOPIC IMPRESSION: Normal  colonoscopy.  RECOMMENDATIONS: Repeat screening examination in 10 years  eSigned:  R. Garfield Cornea, MD FACP [facility] 01/04/16 9:00 AM   cc:  CPT CODES: ICD CODES:  The ICD and CPT codes recommended by this software are interpretations from the data that the clinical staff has captured with the software.  The verification of the translation of this report to the ICD and CPT codes and modifiers is the sole responsibility of the health care institution and practicing physician where this report was generated.  [employer] will not be held responsible for the validity of the ICD and CPT codes included on this report.  AMA assumes no liability for data contained or not contained herein. CPT is a Designer, television/film set of the [employer].

## 2015-12-11 ENCOUNTER — Encounter (HOSPITAL_COMMUNITY): Payer: Self-pay | Admitting: Internal Medicine

## 2016-02-21 DIAGNOSIS — I10 Essential (primary) hypertension: Secondary | ICD-10-CM | POA: Diagnosis not present

## 2016-02-21 DIAGNOSIS — Z6820 Body mass index (BMI) 20.0-20.9, adult: Secondary | ICD-10-CM | POA: Diagnosis not present

## 2016-02-21 DIAGNOSIS — K219 Gastro-esophageal reflux disease without esophagitis: Secondary | ICD-10-CM | POA: Diagnosis not present

## 2016-02-21 DIAGNOSIS — E781 Pure hyperglyceridemia: Secondary | ICD-10-CM | POA: Diagnosis not present

## 2016-02-21 DIAGNOSIS — Z1389 Encounter for screening for other disorder: Secondary | ICD-10-CM | POA: Diagnosis not present

## 2016-02-21 DIAGNOSIS — R7301 Impaired fasting glucose: Secondary | ICD-10-CM | POA: Diagnosis not present

## 2016-02-21 DIAGNOSIS — R109 Unspecified abdominal pain: Secondary | ICD-10-CM | POA: Diagnosis not present

## 2016-02-21 DIAGNOSIS — M7581 Other shoulder lesions, right shoulder: Secondary | ICD-10-CM | POA: Diagnosis not present

## 2016-03-07 DIAGNOSIS — R1011 Right upper quadrant pain: Secondary | ICD-10-CM | POA: Diagnosis not present

## 2016-03-12 DIAGNOSIS — I10 Essential (primary) hypertension: Secondary | ICD-10-CM | POA: Diagnosis not present

## 2016-03-12 DIAGNOSIS — Z0000 Encounter for general adult medical examination without abnormal findings: Secondary | ICD-10-CM | POA: Diagnosis not present

## 2016-03-12 DIAGNOSIS — Z1389 Encounter for screening for other disorder: Secondary | ICD-10-CM | POA: Diagnosis not present

## 2016-03-12 DIAGNOSIS — Z6821 Body mass index (BMI) 21.0-21.9, adult: Secondary | ICD-10-CM | POA: Diagnosis not present

## 2016-03-19 ENCOUNTER — Other Ambulatory Visit (HOSPITAL_COMMUNITY): Payer: Self-pay | Admitting: Internal Medicine

## 2016-03-19 DIAGNOSIS — Z1231 Encounter for screening mammogram for malignant neoplasm of breast: Secondary | ICD-10-CM

## 2016-03-27 ENCOUNTER — Ambulatory Visit (HOSPITAL_COMMUNITY)
Admission: RE | Admit: 2016-03-27 | Discharge: 2016-03-27 | Disposition: A | Discharge status: Home / Self Care | Payer: Medicare Other | Source: Ambulatory Visit | Attending: Internal Medicine | Admitting: Internal Medicine

## 2016-03-27 DIAGNOSIS — Z1231 Encounter for screening mammogram for malignant neoplasm of breast: Secondary | ICD-10-CM | POA: Insufficient documentation

## 2016-09-01 ENCOUNTER — Ambulatory Visit (INDEPENDENT_AMBULATORY_CARE_PROVIDER_SITE_OTHER): Payer: Medicare Other | Admitting: Otolaryngology

## 2016-09-01 DIAGNOSIS — H7202 Central perforation of tympanic membrane, left ear: Secondary | ICD-10-CM | POA: Diagnosis not present

## 2016-09-01 DIAGNOSIS — H6123 Impacted cerumen, bilateral: Secondary | ICD-10-CM

## 2016-09-01 DIAGNOSIS — H906 Mixed conductive and sensorineural hearing loss, bilateral: Secondary | ICD-10-CM

## 2016-09-15 ENCOUNTER — Ambulatory Visit (INDEPENDENT_AMBULATORY_CARE_PROVIDER_SITE_OTHER): Payer: Medicare Other | Admitting: Otolaryngology

## 2016-09-15 DIAGNOSIS — H6983 Other specified disorders of Eustachian tube, bilateral: Secondary | ICD-10-CM

## 2016-09-15 DIAGNOSIS — H906 Mixed conductive and sensorineural hearing loss, bilateral: Secondary | ICD-10-CM

## 2016-09-15 DIAGNOSIS — H7202 Central perforation of tympanic membrane, left ear: Secondary | ICD-10-CM

## 2016-10-30 DIAGNOSIS — Z6821 Body mass index (BMI) 21.0-21.9, adult: Secondary | ICD-10-CM | POA: Diagnosis not present

## 2016-10-30 DIAGNOSIS — Z23 Encounter for immunization: Secondary | ICD-10-CM | POA: Diagnosis not present

## 2016-10-30 DIAGNOSIS — I10 Essential (primary) hypertension: Secondary | ICD-10-CM | POA: Diagnosis not present

## 2016-10-30 DIAGNOSIS — M810 Age-related osteoporosis without current pathological fracture: Secondary | ICD-10-CM | POA: Diagnosis not present

## 2016-10-30 DIAGNOSIS — Z1389 Encounter for screening for other disorder: Secondary | ICD-10-CM | POA: Diagnosis not present

## 2017-01-29 DIAGNOSIS — I10 Essential (primary) hypertension: Secondary | ICD-10-CM | POA: Diagnosis not present

## 2017-01-29 DIAGNOSIS — J069 Acute upper respiratory infection, unspecified: Secondary | ICD-10-CM | POA: Diagnosis not present

## 2017-01-29 DIAGNOSIS — B029 Zoster without complications: Secondary | ICD-10-CM | POA: Diagnosis not present

## 2017-01-29 DIAGNOSIS — R070 Pain in throat: Secondary | ICD-10-CM | POA: Diagnosis not present

## 2017-01-29 DIAGNOSIS — Z1389 Encounter for screening for other disorder: Secondary | ICD-10-CM | POA: Diagnosis not present

## 2017-01-29 DIAGNOSIS — M810 Age-related osteoporosis without current pathological fracture: Secondary | ICD-10-CM | POA: Diagnosis not present

## 2017-01-29 DIAGNOSIS — R6889 Other general symptoms and signs: Secondary | ICD-10-CM | POA: Diagnosis not present

## 2017-01-29 DIAGNOSIS — J343 Hypertrophy of nasal turbinates: Secondary | ICD-10-CM | POA: Diagnosis not present

## 2017-01-29 DIAGNOSIS — Z6821 Body mass index (BMI) 21.0-21.9, adult: Secondary | ICD-10-CM | POA: Diagnosis not present

## 2017-01-29 DIAGNOSIS — K219 Gastro-esophageal reflux disease without esophagitis: Secondary | ICD-10-CM | POA: Diagnosis not present

## 2017-03-23 ENCOUNTER — Ambulatory Visit (INDEPENDENT_AMBULATORY_CARE_PROVIDER_SITE_OTHER): Payer: Medicare Other | Admitting: Otolaryngology

## 2017-03-23 DIAGNOSIS — H7202 Central perforation of tympanic membrane, left ear: Secondary | ICD-10-CM

## 2017-03-23 DIAGNOSIS — H6981 Other specified disorders of Eustachian tube, right ear: Secondary | ICD-10-CM

## 2017-03-23 DIAGNOSIS — J310 Chronic rhinitis: Secondary | ICD-10-CM

## 2017-03-23 DIAGNOSIS — H6123 Impacted cerumen, bilateral: Secondary | ICD-10-CM | POA: Diagnosis not present

## 2017-04-15 DIAGNOSIS — Z0000 Encounter for general adult medical examination without abnormal findings: Secondary | ICD-10-CM | POA: Diagnosis not present

## 2017-04-15 DIAGNOSIS — E782 Mixed hyperlipidemia: Secondary | ICD-10-CM | POA: Diagnosis not present

## 2017-04-15 DIAGNOSIS — Z1389 Encounter for screening for other disorder: Secondary | ICD-10-CM | POA: Diagnosis not present

## 2017-04-15 DIAGNOSIS — Z6820 Body mass index (BMI) 20.0-20.9, adult: Secondary | ICD-10-CM | POA: Diagnosis not present

## 2017-04-15 DIAGNOSIS — R7301 Impaired fasting glucose: Secondary | ICD-10-CM | POA: Diagnosis not present

## 2017-04-15 DIAGNOSIS — R5383 Other fatigue: Secondary | ICD-10-CM | POA: Diagnosis not present

## 2017-04-21 ENCOUNTER — Other Ambulatory Visit (HOSPITAL_COMMUNITY): Payer: Self-pay | Admitting: Internal Medicine

## 2017-04-21 DIAGNOSIS — Z1231 Encounter for screening mammogram for malignant neoplasm of breast: Secondary | ICD-10-CM

## 2017-05-01 ENCOUNTER — Ambulatory Visit (HOSPITAL_COMMUNITY)
Admission: RE | Admit: 2017-05-01 | Discharge: 2017-05-01 | Disposition: A | Discharge status: Home / Self Care | Payer: Medicare Other | Source: Ambulatory Visit | Attending: Internal Medicine | Admitting: Internal Medicine

## 2017-05-01 DIAGNOSIS — Z1231 Encounter for screening mammogram for malignant neoplasm of breast: Secondary | ICD-10-CM | POA: Diagnosis not present

## 2017-05-22 DIAGNOSIS — E039 Hypothyroidism, unspecified: Secondary | ICD-10-CM | POA: Diagnosis not present

## 2017-06-25 DIAGNOSIS — E039 Hypothyroidism, unspecified: Secondary | ICD-10-CM | POA: Diagnosis not present

## 2017-09-04 DIAGNOSIS — H5203 Hypermetropia, bilateral: Secondary | ICD-10-CM | POA: Diagnosis not present

## 2017-09-04 DIAGNOSIS — H52223 Regular astigmatism, bilateral: Secondary | ICD-10-CM | POA: Diagnosis not present

## 2017-09-04 DIAGNOSIS — H26491 Other secondary cataract, right eye: Secondary | ICD-10-CM | POA: Diagnosis not present

## 2017-09-04 DIAGNOSIS — H524 Presbyopia: Secondary | ICD-10-CM | POA: Diagnosis not present

## 2017-09-17 DIAGNOSIS — Z6822 Body mass index (BMI) 22.0-22.9, adult: Secondary | ICD-10-CM | POA: Diagnosis not present

## 2017-09-17 DIAGNOSIS — Z23 Encounter for immunization: Secondary | ICD-10-CM | POA: Diagnosis not present

## 2017-09-17 DIAGNOSIS — J301 Allergic rhinitis due to pollen: Secondary | ICD-10-CM | POA: Diagnosis not present

## 2017-09-17 DIAGNOSIS — J040 Acute laryngitis: Secondary | ICD-10-CM | POA: Diagnosis not present

## 2017-09-21 ENCOUNTER — Ambulatory Visit (INDEPENDENT_AMBULATORY_CARE_PROVIDER_SITE_OTHER): Payer: Medicare Other | Admitting: Otolaryngology

## 2017-09-21 DIAGNOSIS — H6123 Impacted cerumen, bilateral: Secondary | ICD-10-CM

## 2017-09-21 DIAGNOSIS — H7202 Central perforation of tympanic membrane, left ear: Secondary | ICD-10-CM | POA: Diagnosis not present

## 2017-09-21 DIAGNOSIS — H6983 Other specified disorders of Eustachian tube, bilateral: Secondary | ICD-10-CM | POA: Diagnosis not present

## 2017-10-21 DIAGNOSIS — Z1283 Encounter for screening for malignant neoplasm of skin: Secondary | ICD-10-CM | POA: Diagnosis not present

## 2017-11-02 DIAGNOSIS — H26493 Other secondary cataract, bilateral: Secondary | ICD-10-CM | POA: Diagnosis not present

## 2018-03-22 ENCOUNTER — Ambulatory Visit (INDEPENDENT_AMBULATORY_CARE_PROVIDER_SITE_OTHER): Payer: Medicare Other | Admitting: Otolaryngology

## 2018-03-22 DIAGNOSIS — H6123 Impacted cerumen, bilateral: Secondary | ICD-10-CM

## 2018-03-22 DIAGNOSIS — H906 Mixed conductive and sensorineural hearing loss, bilateral: Secondary | ICD-10-CM

## 2018-03-22 DIAGNOSIS — H7202 Central perforation of tympanic membrane, left ear: Secondary | ICD-10-CM

## 2018-03-23 DIAGNOSIS — Z1389 Encounter for screening for other disorder: Secondary | ICD-10-CM | POA: Diagnosis not present

## 2018-03-23 DIAGNOSIS — Z6821 Body mass index (BMI) 21.0-21.9, adult: Secondary | ICD-10-CM | POA: Diagnosis not present

## 2018-03-23 DIAGNOSIS — M545 Low back pain: Secondary | ICD-10-CM | POA: Diagnosis not present

## 2018-04-27 DIAGNOSIS — Z0000 Encounter for general adult medical examination without abnormal findings: Secondary | ICD-10-CM | POA: Diagnosis not present

## 2018-04-27 DIAGNOSIS — E039 Hypothyroidism, unspecified: Secondary | ICD-10-CM | POA: Diagnosis not present

## 2018-04-27 DIAGNOSIS — Z6821 Body mass index (BMI) 21.0-21.9, adult: Secondary | ICD-10-CM | POA: Diagnosis not present

## 2018-04-27 DIAGNOSIS — Z0001 Encounter for general adult medical examination with abnormal findings: Secondary | ICD-10-CM | POA: Diagnosis not present

## 2018-04-27 DIAGNOSIS — Z1389 Encounter for screening for other disorder: Secondary | ICD-10-CM | POA: Diagnosis not present

## 2018-04-27 DIAGNOSIS — L309 Dermatitis, unspecified: Secondary | ICD-10-CM | POA: Diagnosis not present

## 2018-06-04 DIAGNOSIS — Z961 Presence of intraocular lens: Secondary | ICD-10-CM | POA: Diagnosis not present

## 2018-06-04 DIAGNOSIS — H5203 Hypermetropia, bilateral: Secondary | ICD-10-CM | POA: Diagnosis not present

## 2018-06-04 DIAGNOSIS — H524 Presbyopia: Secondary | ICD-10-CM | POA: Diagnosis not present

## 2018-06-04 DIAGNOSIS — H52223 Regular astigmatism, bilateral: Secondary | ICD-10-CM | POA: Diagnosis not present

## 2018-06-10 ENCOUNTER — Other Ambulatory Visit (HOSPITAL_COMMUNITY): Payer: Self-pay | Admitting: Internal Medicine

## 2018-06-10 DIAGNOSIS — Z1231 Encounter for screening mammogram for malignant neoplasm of breast: Secondary | ICD-10-CM

## 2018-06-18 ENCOUNTER — Ambulatory Visit (HOSPITAL_COMMUNITY)
Admission: RE | Admit: 2018-06-18 | Discharge: 2018-06-18 | Disposition: A | Discharge status: Home / Self Care | Payer: Medicare Other | Source: Ambulatory Visit | Attending: Internal Medicine | Admitting: Internal Medicine

## 2018-06-18 ENCOUNTER — Encounter (HOSPITAL_COMMUNITY): Payer: Self-pay

## 2018-06-18 DIAGNOSIS — Z1231 Encounter for screening mammogram for malignant neoplasm of breast: Secondary | ICD-10-CM | POA: Insufficient documentation

## 2018-09-20 ENCOUNTER — Ambulatory Visit (INDEPENDENT_AMBULATORY_CARE_PROVIDER_SITE_OTHER): Payer: Medicare Other | Admitting: Otolaryngology

## 2018-09-20 DIAGNOSIS — H6983 Other specified disorders of Eustachian tube, bilateral: Secondary | ICD-10-CM

## 2018-09-20 DIAGNOSIS — H7203 Central perforation of tympanic membrane, bilateral: Secondary | ICD-10-CM | POA: Diagnosis not present

## 2018-09-20 DIAGNOSIS — H6123 Impacted cerumen, bilateral: Secondary | ICD-10-CM | POA: Diagnosis not present

## 2018-10-26 DIAGNOSIS — Z1389 Encounter for screening for other disorder: Secondary | ICD-10-CM | POA: Diagnosis not present

## 2018-10-26 DIAGNOSIS — I10 Essential (primary) hypertension: Secondary | ICD-10-CM | POA: Diagnosis not present

## 2018-10-26 DIAGNOSIS — Z23 Encounter for immunization: Secondary | ICD-10-CM | POA: Diagnosis not present

## 2018-10-26 DIAGNOSIS — Z6821 Body mass index (BMI) 21.0-21.9, adult: Secondary | ICD-10-CM | POA: Diagnosis not present

## 2018-10-26 DIAGNOSIS — E039 Hypothyroidism, unspecified: Secondary | ICD-10-CM | POA: Diagnosis not present

## 2018-11-03 DIAGNOSIS — J22 Unspecified acute lower respiratory infection: Secondary | ICD-10-CM | POA: Diagnosis not present

## 2018-11-03 DIAGNOSIS — J209 Acute bronchitis, unspecified: Secondary | ICD-10-CM | POA: Diagnosis not present

## 2018-11-03 DIAGNOSIS — Z6821 Body mass index (BMI) 21.0-21.9, adult: Secondary | ICD-10-CM | POA: Diagnosis not present

## 2019-03-24 DIAGNOSIS — I10 Essential (primary) hypertension: Secondary | ICD-10-CM | POA: Diagnosis not present

## 2019-03-24 DIAGNOSIS — E039 Hypothyroidism, unspecified: Secondary | ICD-10-CM | POA: Diagnosis not present

## 2019-03-24 DIAGNOSIS — Z6821 Body mass index (BMI) 21.0-21.9, adult: Secondary | ICD-10-CM | POA: Diagnosis not present

## 2019-03-24 DIAGNOSIS — S20211A Contusion of right front wall of thorax, initial encounter: Secondary | ICD-10-CM | POA: Diagnosis not present

## 2019-03-24 DIAGNOSIS — E748 Other specified disorders of carbohydrate metabolism: Secondary | ICD-10-CM | POA: Diagnosis not present

## 2019-03-24 DIAGNOSIS — E063 Autoimmune thyroiditis: Secondary | ICD-10-CM | POA: Diagnosis not present

## 2019-03-24 DIAGNOSIS — R7309 Other abnormal glucose: Secondary | ICD-10-CM | POA: Diagnosis not present

## 2019-03-24 DIAGNOSIS — S40011A Contusion of right shoulder, initial encounter: Secondary | ICD-10-CM | POA: Diagnosis not present

## 2019-03-24 DIAGNOSIS — Z13220 Encounter for screening for lipoid disorders: Secondary | ICD-10-CM | POA: Diagnosis not present

## 2019-03-24 DIAGNOSIS — Z1389 Encounter for screening for other disorder: Secondary | ICD-10-CM | POA: Diagnosis not present

## 2019-03-24 DIAGNOSIS — M810 Age-related osteoporosis without current pathological fracture: Secondary | ICD-10-CM | POA: Diagnosis not present

## 2019-05-02 DIAGNOSIS — Z0000 Encounter for general adult medical examination without abnormal findings: Secondary | ICD-10-CM | POA: Diagnosis not present

## 2019-05-02 DIAGNOSIS — Z681 Body mass index (BMI) 19 or less, adult: Secondary | ICD-10-CM | POA: Diagnosis not present

## 2019-05-02 DIAGNOSIS — Z1389 Encounter for screening for other disorder: Secondary | ICD-10-CM | POA: Diagnosis not present

## 2019-06-20 ENCOUNTER — Other Ambulatory Visit (HOSPITAL_COMMUNITY): Payer: Self-pay | Admitting: Internal Medicine

## 2019-06-20 DIAGNOSIS — Z1231 Encounter for screening mammogram for malignant neoplasm of breast: Secondary | ICD-10-CM

## 2019-07-05 DIAGNOSIS — Z6820 Body mass index (BMI) 20.0-20.9, adult: Secondary | ICD-10-CM | POA: Diagnosis not present

## 2019-07-05 DIAGNOSIS — J019 Acute sinusitis, unspecified: Secondary | ICD-10-CM | POA: Diagnosis not present

## 2019-07-05 DIAGNOSIS — Z1389 Encounter for screening for other disorder: Secondary | ICD-10-CM | POA: Diagnosis not present

## 2019-07-05 DIAGNOSIS — H6993 Unspecified Eustachian tube disorder, bilateral: Secondary | ICD-10-CM | POA: Diagnosis not present

## 2019-07-06 ENCOUNTER — Other Ambulatory Visit: Payer: Self-pay

## 2019-07-06 ENCOUNTER — Ambulatory Visit (HOSPITAL_COMMUNITY)
Admission: RE | Admit: 2019-07-06 | Discharge: 2019-07-06 | Disposition: A | Discharge status: Home / Self Care | Payer: Medicare Other | Source: Ambulatory Visit | Attending: Internal Medicine | Admitting: Internal Medicine

## 2019-07-06 DIAGNOSIS — Z1231 Encounter for screening mammogram for malignant neoplasm of breast: Secondary | ICD-10-CM | POA: Insufficient documentation

## 2019-08-01 ENCOUNTER — Ambulatory Visit (INDEPENDENT_AMBULATORY_CARE_PROVIDER_SITE_OTHER): Payer: Medicare Other | Admitting: Otolaryngology

## 2019-08-01 DIAGNOSIS — H6122 Impacted cerumen, left ear: Secondary | ICD-10-CM

## 2019-08-01 DIAGNOSIS — H7203 Central perforation of tympanic membrane, bilateral: Secondary | ICD-10-CM

## 2019-08-15 ENCOUNTER — Ambulatory Visit (INDEPENDENT_AMBULATORY_CARE_PROVIDER_SITE_OTHER): Payer: Medicare Other | Admitting: Otolaryngology

## 2019-08-15 DIAGNOSIS — H7203 Central perforation of tympanic membrane, bilateral: Secondary | ICD-10-CM | POA: Diagnosis not present

## 2019-08-15 DIAGNOSIS — H6121 Impacted cerumen, right ear: Secondary | ICD-10-CM

## 2019-09-29 DIAGNOSIS — Z23 Encounter for immunization: Secondary | ICD-10-CM | POA: Diagnosis not present

## 2019-12-06 DIAGNOSIS — Z6820 Body mass index (BMI) 20.0-20.9, adult: Secondary | ICD-10-CM | POA: Diagnosis not present

## 2019-12-06 DIAGNOSIS — R42 Dizziness and giddiness: Secondary | ICD-10-CM | POA: Diagnosis not present

## 2019-12-06 DIAGNOSIS — H6691 Otitis media, unspecified, right ear: Secondary | ICD-10-CM | POA: Diagnosis not present

## 2019-12-06 DIAGNOSIS — J329 Chronic sinusitis, unspecified: Secondary | ICD-10-CM | POA: Diagnosis not present

## 2019-12-28 DIAGNOSIS — H5200 Hypermetropia, unspecified eye: Secondary | ICD-10-CM | POA: Diagnosis not present

## 2019-12-28 DIAGNOSIS — Z0100 Encounter for examination of eyes and vision without abnormal findings: Secondary | ICD-10-CM | POA: Diagnosis not present

## 2020-02-13 DIAGNOSIS — H6983 Other specified disorders of Eustachian tube, bilateral: Secondary | ICD-10-CM | POA: Diagnosis not present

## 2020-02-13 DIAGNOSIS — H6123 Impacted cerumen, bilateral: Secondary | ICD-10-CM | POA: Diagnosis not present

## 2020-02-13 DIAGNOSIS — H7201 Central perforation of tympanic membrane, right ear: Secondary | ICD-10-CM | POA: Diagnosis not present

## 2020-02-29 DIAGNOSIS — I10 Essential (primary) hypertension: Secondary | ICD-10-CM | POA: Diagnosis not present

## 2020-02-29 DIAGNOSIS — M810 Age-related osteoporosis without current pathological fracture: Secondary | ICD-10-CM | POA: Diagnosis not present

## 2020-02-29 DIAGNOSIS — E039 Hypothyroidism, unspecified: Secondary | ICD-10-CM | POA: Diagnosis not present

## 2020-03-30 DIAGNOSIS — M810 Age-related osteoporosis without current pathological fracture: Secondary | ICD-10-CM | POA: Diagnosis not present

## 2020-03-30 DIAGNOSIS — E039 Hypothyroidism, unspecified: Secondary | ICD-10-CM | POA: Diagnosis not present

## 2020-03-30 DIAGNOSIS — I10 Essential (primary) hypertension: Secondary | ICD-10-CM | POA: Diagnosis not present

## 2020-05-02 DIAGNOSIS — Z0000 Encounter for general adult medical examination without abnormal findings: Secondary | ICD-10-CM | POA: Diagnosis not present

## 2020-05-02 DIAGNOSIS — R7301 Impaired fasting glucose: Secondary | ICD-10-CM | POA: Diagnosis not present

## 2020-05-02 DIAGNOSIS — Z1389 Encounter for screening for other disorder: Secondary | ICD-10-CM | POA: Diagnosis not present

## 2020-05-02 DIAGNOSIS — Z0001 Encounter for general adult medical examination with abnormal findings: Secondary | ICD-10-CM | POA: Diagnosis not present

## 2020-05-02 DIAGNOSIS — Z6820 Body mass index (BMI) 20.0-20.9, adult: Secondary | ICD-10-CM | POA: Diagnosis not present

## 2020-05-02 DIAGNOSIS — E063 Autoimmune thyroiditis: Secondary | ICD-10-CM | POA: Diagnosis not present

## 2020-05-02 DIAGNOSIS — K219 Gastro-esophageal reflux disease without esophagitis: Secondary | ICD-10-CM | POA: Diagnosis not present

## 2020-05-02 DIAGNOSIS — I10 Essential (primary) hypertension: Secondary | ICD-10-CM | POA: Diagnosis not present

## 2020-05-02 DIAGNOSIS — E039 Hypothyroidism, unspecified: Secondary | ICD-10-CM | POA: Diagnosis not present

## 2020-05-30 DIAGNOSIS — I10 Essential (primary) hypertension: Secondary | ICD-10-CM | POA: Diagnosis not present

## 2020-05-30 DIAGNOSIS — E039 Hypothyroidism, unspecified: Secondary | ICD-10-CM | POA: Diagnosis not present

## 2020-05-30 DIAGNOSIS — M810 Age-related osteoporosis without current pathological fracture: Secondary | ICD-10-CM | POA: Diagnosis not present

## 2020-07-16 DIAGNOSIS — H7203 Central perforation of tympanic membrane, bilateral: Secondary | ICD-10-CM | POA: Diagnosis not present

## 2020-07-16 DIAGNOSIS — H6123 Impacted cerumen, bilateral: Secondary | ICD-10-CM | POA: Diagnosis not present

## 2020-07-23 DIAGNOSIS — Z6820 Body mass index (BMI) 20.0-20.9, adult: Secondary | ICD-10-CM | POA: Diagnosis not present

## 2020-07-23 DIAGNOSIS — L03818 Cellulitis of other sites: Secondary | ICD-10-CM | POA: Diagnosis not present

## 2020-07-31 DIAGNOSIS — M810 Age-related osteoporosis without current pathological fracture: Secondary | ICD-10-CM | POA: Diagnosis not present

## 2020-07-31 DIAGNOSIS — I10 Essential (primary) hypertension: Secondary | ICD-10-CM | POA: Diagnosis not present

## 2020-07-31 DIAGNOSIS — E039 Hypothyroidism, unspecified: Secondary | ICD-10-CM | POA: Diagnosis not present

## 2020-09-25 ENCOUNTER — Other Ambulatory Visit (HOSPITAL_COMMUNITY): Payer: Self-pay | Admitting: Internal Medicine

## 2020-09-25 DIAGNOSIS — Z1231 Encounter for screening mammogram for malignant neoplasm of breast: Secondary | ICD-10-CM

## 2020-09-29 DIAGNOSIS — M810 Age-related osteoporosis without current pathological fracture: Secondary | ICD-10-CM | POA: Diagnosis not present

## 2020-09-29 DIAGNOSIS — E039 Hypothyroidism, unspecified: Secondary | ICD-10-CM | POA: Diagnosis not present

## 2020-09-29 DIAGNOSIS — I10 Essential (primary) hypertension: Secondary | ICD-10-CM | POA: Diagnosis not present

## 2020-10-02 DIAGNOSIS — R69 Illness, unspecified: Secondary | ICD-10-CM | POA: Diagnosis not present

## 2020-10-10 ENCOUNTER — Ambulatory Visit (HOSPITAL_COMMUNITY)
Admission: RE | Admit: 2020-10-10 | Discharge: 2020-10-10 | Disposition: A | Discharge status: Home / Self Care | Payer: Medicare HMO | Source: Ambulatory Visit | Attending: Internal Medicine | Admitting: Internal Medicine

## 2020-10-10 ENCOUNTER — Other Ambulatory Visit: Payer: Self-pay

## 2020-10-10 DIAGNOSIS — Z1231 Encounter for screening mammogram for malignant neoplasm of breast: Secondary | ICD-10-CM | POA: Insufficient documentation

## 2020-11-01 ENCOUNTER — Ambulatory Visit: Payer: Medicare HMO | Attending: Internal Medicine

## 2020-11-01 DIAGNOSIS — Z23 Encounter for immunization: Secondary | ICD-10-CM

## 2020-11-01 NOTE — Progress Notes (Signed)
   Covid-19 Vaccination Clinic  Name:  Rachael Rios    MRN: [id number] DOB: 1951-09-08  11/01/2020  [name] was observed post Covid-19 immunization for 15 minutes without incident. She was provided with Vaccine Information Sheet and instruction to access the V-Safe system.   [name] was instructed to call 911 with any severe reactions post vaccine: Marland Kitchen Difficulty breathing  . Swelling of face and throat  . A fast heartbeat  . A bad rash all over body  . Dizziness and weakness   Immunizations Administered    No immunizations on file.

## 2020-11-30 DIAGNOSIS — E039 Hypothyroidism, unspecified: Secondary | ICD-10-CM | POA: Diagnosis not present

## 2020-11-30 DIAGNOSIS — M810 Age-related osteoporosis without current pathological fracture: Secondary | ICD-10-CM | POA: Diagnosis not present

## 2020-11-30 DIAGNOSIS — I10 Essential (primary) hypertension: Secondary | ICD-10-CM | POA: Diagnosis not present

## 2021-01-14 DIAGNOSIS — H7203 Central perforation of tympanic membrane, bilateral: Secondary | ICD-10-CM | POA: Diagnosis not present

## 2021-01-14 DIAGNOSIS — H6122 Impacted cerumen, left ear: Secondary | ICD-10-CM | POA: Diagnosis not present

## 2021-03-30 DIAGNOSIS — I10 Essential (primary) hypertension: Secondary | ICD-10-CM | POA: Diagnosis not present

## 2021-03-30 DIAGNOSIS — M810 Age-related osteoporosis without current pathological fracture: Secondary | ICD-10-CM | POA: Diagnosis not present

## 2021-03-30 DIAGNOSIS — E039 Hypothyroidism, unspecified: Secondary | ICD-10-CM | POA: Diagnosis not present

## 2021-05-02 DIAGNOSIS — Z1389 Encounter for screening for other disorder: Secondary | ICD-10-CM | POA: Diagnosis not present

## 2021-05-02 DIAGNOSIS — E039 Hypothyroidism, unspecified: Secondary | ICD-10-CM | POA: Diagnosis not present

## 2021-05-02 DIAGNOSIS — Z1331 Encounter for screening for depression: Secondary | ICD-10-CM | POA: Diagnosis not present

## 2021-05-02 DIAGNOSIS — Z6821 Body mass index (BMI) 21.0-21.9, adult: Secondary | ICD-10-CM | POA: Diagnosis not present

## 2021-05-02 DIAGNOSIS — Z0000 Encounter for general adult medical examination without abnormal findings: Secondary | ICD-10-CM | POA: Diagnosis not present

## 2021-05-02 DIAGNOSIS — Z0001 Encounter for general adult medical examination with abnormal findings: Secondary | ICD-10-CM | POA: Diagnosis not present

## 2021-05-15 DIAGNOSIS — Z0100 Encounter for examination of eyes and vision without abnormal findings: Secondary | ICD-10-CM | POA: Diagnosis not present

## 2021-05-15 DIAGNOSIS — H5200 Hypermetropia, unspecified eye: Secondary | ICD-10-CM | POA: Diagnosis not present

## 2021-05-30 DIAGNOSIS — I10 Essential (primary) hypertension: Secondary | ICD-10-CM | POA: Diagnosis not present

## 2021-05-30 DIAGNOSIS — E039 Hypothyroidism, unspecified: Secondary | ICD-10-CM | POA: Diagnosis not present

## 2021-05-30 DIAGNOSIS — M810 Age-related osteoporosis without current pathological fracture: Secondary | ICD-10-CM | POA: Diagnosis not present

## 2021-07-16 DIAGNOSIS — H6122 Impacted cerumen, left ear: Secondary | ICD-10-CM | POA: Diagnosis not present

## 2021-07-16 DIAGNOSIS — H7203 Central perforation of tympanic membrane, bilateral: Secondary | ICD-10-CM | POA: Diagnosis not present

## 2021-09-19 DIAGNOSIS — Z23 Encounter for immunization: Secondary | ICD-10-CM | POA: Diagnosis not present

## 2021-09-19 DIAGNOSIS — H1010 Acute atopic conjunctivitis, unspecified eye: Secondary | ICD-10-CM | POA: Diagnosis not present

## 2021-09-19 DIAGNOSIS — J302 Other seasonal allergic rhinitis: Secondary | ICD-10-CM | POA: Diagnosis not present

## 2021-09-19 DIAGNOSIS — Z6820 Body mass index (BMI) 20.0-20.9, adult: Secondary | ICD-10-CM | POA: Diagnosis not present

## 2021-10-18 ENCOUNTER — Other Ambulatory Visit (HOSPITAL_COMMUNITY): Payer: Self-pay | Admitting: Internal Medicine

## 2021-10-18 DIAGNOSIS — Z1231 Encounter for screening mammogram for malignant neoplasm of breast: Secondary | ICD-10-CM

## 2021-10-31 ENCOUNTER — Other Ambulatory Visit: Payer: Self-pay

## 2021-10-31 ENCOUNTER — Ambulatory Visit (HOSPITAL_COMMUNITY)
Admission: RE | Admit: 2021-10-31 | Discharge: 2021-10-31 | Disposition: A | Discharge status: Home / Self Care | Payer: Medicare HMO | Source: Ambulatory Visit | Attending: Internal Medicine | Admitting: Internal Medicine

## 2021-10-31 DIAGNOSIS — Z1231 Encounter for screening mammogram for malignant neoplasm of breast: Secondary | ICD-10-CM | POA: Diagnosis not present

## 2021-11-23 ENCOUNTER — Encounter (HOSPITAL_COMMUNITY): Payer: Self-pay

## 2021-11-23 ENCOUNTER — Inpatient Hospital Stay (HOSPITAL_COMMUNITY)
Admission: EM | Admit: 2021-11-23 | Discharge: 2021-11-26 | DRG: 563 | Disposition: A | Discharge status: Skilled Nursing Facility | Payer: Medicare HMO | Attending: Internal Medicine | Admitting: Internal Medicine

## 2021-11-23 ENCOUNTER — Emergency Department (HOSPITAL_COMMUNITY): Payer: Medicare HMO

## 2021-11-23 ENCOUNTER — Other Ambulatory Visit: Payer: Self-pay

## 2021-11-23 DIAGNOSIS — S0512XA Contusion of eyeball and orbital tissues, left eye, initial encounter: Secondary | ICD-10-CM | POA: Diagnosis not present

## 2021-11-23 DIAGNOSIS — Z79890 Hormone replacement therapy (postmenopausal): Secondary | ICD-10-CM | POA: Diagnosis not present

## 2021-11-23 DIAGNOSIS — Z20822 Contact with and (suspected) exposure to covid-19: Secondary | ICD-10-CM | POA: Diagnosis not present

## 2021-11-23 DIAGNOSIS — Y92017 Garden or yard in single-family (private) house as the place of occurrence of the external cause: Secondary | ICD-10-CM

## 2021-11-23 DIAGNOSIS — S52521A Torus fracture of lower end of right radius, initial encounter for closed fracture: Secondary | ICD-10-CM | POA: Diagnosis not present

## 2021-11-23 DIAGNOSIS — E039 Hypothyroidism, unspecified: Secondary | ICD-10-CM | POA: Diagnosis not present

## 2021-11-23 DIAGNOSIS — S80811A Abrasion, right lower leg, initial encounter: Secondary | ICD-10-CM | POA: Diagnosis present

## 2021-11-23 DIAGNOSIS — W1789XA Other fall from one level to another, initial encounter: Secondary | ICD-10-CM | POA: Diagnosis not present

## 2021-11-23 DIAGNOSIS — Y9301 Activity, walking, marching and hiking: Secondary | ICD-10-CM | POA: Diagnosis present

## 2021-11-23 DIAGNOSIS — E440 Moderate protein-calorie malnutrition: Secondary | ICD-10-CM | POA: Diagnosis not present

## 2021-11-23 DIAGNOSIS — Z7983 Long term (current) use of bisphosphonates: Secondary | ICD-10-CM

## 2021-11-23 DIAGNOSIS — M25512 Pain in left shoulder: Secondary | ICD-10-CM | POA: Diagnosis not present

## 2021-11-23 DIAGNOSIS — S42215A Unspecified nondisplaced fracture of surgical neck of left humerus, initial encounter for closed fracture: Principal | ICD-10-CM | POA: Diagnosis present

## 2021-11-23 DIAGNOSIS — S62101A Fracture of unspecified carpal bone, right wrist, initial encounter for closed fracture: Secondary | ICD-10-CM

## 2021-11-23 DIAGNOSIS — S01112A Laceration without foreign body of left eyelid and periocular area, initial encounter: Secondary | ICD-10-CM | POA: Diagnosis not present

## 2021-11-23 DIAGNOSIS — S59291A Other physeal fracture of lower end of radius, right arm, initial encounter for closed fracture: Secondary | ICD-10-CM | POA: Diagnosis not present

## 2021-11-23 DIAGNOSIS — S52301A Unspecified fracture of shaft of right radius, initial encounter for closed fracture: Secondary | ICD-10-CM | POA: Diagnosis present

## 2021-11-23 DIAGNOSIS — S40012A Contusion of left shoulder, initial encounter: Secondary | ICD-10-CM | POA: Diagnosis present

## 2021-11-23 DIAGNOSIS — Z79899 Other long term (current) drug therapy: Secondary | ICD-10-CM

## 2021-11-23 DIAGNOSIS — I10 Essential (primary) hypertension: Secondary | ICD-10-CM | POA: Diagnosis present

## 2021-11-23 DIAGNOSIS — W19XXXA Unspecified fall, initial encounter: Secondary | ICD-10-CM | POA: Diagnosis not present

## 2021-11-23 DIAGNOSIS — Z681 Body mass index (BMI) 19 or less, adult: Secondary | ICD-10-CM

## 2021-11-23 DIAGNOSIS — S42309A Unspecified fracture of shaft of humerus, unspecified arm, initial encounter for closed fracture: Secondary | ICD-10-CM | POA: Diagnosis present

## 2021-11-23 DIAGNOSIS — M25519 Pain in unspecified shoulder: Secondary | ICD-10-CM | POA: Diagnosis not present

## 2021-11-23 DIAGNOSIS — S01111A Laceration without foreign body of right eyelid and periocular area, initial encounter: Secondary | ICD-10-CM | POA: Diagnosis not present

## 2021-11-23 DIAGNOSIS — S52611A Displaced fracture of right ulna styloid process, initial encounter for closed fracture: Secondary | ICD-10-CM | POA: Diagnosis present

## 2021-11-23 DIAGNOSIS — S6991XA Unspecified injury of right wrist, hand and finger(s), initial encounter: Secondary | ICD-10-CM | POA: Diagnosis not present

## 2021-11-23 DIAGNOSIS — S0990XA Unspecified injury of head, initial encounter: Secondary | ICD-10-CM | POA: Diagnosis not present

## 2021-11-23 DIAGNOSIS — S62109A Fracture of unspecified carpal bone, unspecified wrist, initial encounter for closed fracture: Secondary | ICD-10-CM | POA: Diagnosis present

## 2021-11-23 DIAGNOSIS — S0181XA Laceration without foreign body of other part of head, initial encounter: Secondary | ICD-10-CM

## 2021-11-23 DIAGNOSIS — Z743 Need for continuous supervision: Secondary | ICD-10-CM | POA: Diagnosis not present

## 2021-11-23 DIAGNOSIS — M25539 Pain in unspecified wrist: Secondary | ICD-10-CM | POA: Diagnosis not present

## 2021-11-23 DIAGNOSIS — M25531 Pain in right wrist: Secondary | ICD-10-CM | POA: Diagnosis not present

## 2021-11-23 DIAGNOSIS — R9082 White matter disease, unspecified: Secondary | ICD-10-CM | POA: Diagnosis not present

## 2021-11-23 DIAGNOSIS — S42212A Unspecified displaced fracture of surgical neck of left humerus, initial encounter for closed fracture: Secondary | ICD-10-CM | POA: Diagnosis not present

## 2021-11-23 LAB — CBC WITH DIFFERENTIAL/PLATELET
Abs Immature Granulocytes: 0.03 10*3/uL (ref 0.00–0.07)
Basophils Absolute: 0 10*3/uL (ref 0.0–0.1)
Basophils Relative: 1 %
Eosinophils Absolute: 0 10*3/uL (ref 0.0–0.5)
Eosinophils Relative: 1 %
HCT: 36 % (ref 36.0–46.0)
Hemoglobin: 12.8 g/dL (ref 12.0–15.0)
Immature Granulocytes: 1 %
Lymphocytes Relative: 17 %
Lymphs Abs: 1 10*3/uL (ref 0.7–4.0)
MCH: 32.2 pg (ref 26.0–34.0)
MCHC: 35.6 g/dL (ref 30.0–36.0)
MCV: 90.5 fL (ref 80.0–100.0)
Monocytes Absolute: 0.6 10*3/uL (ref 0.1–1.0)
Monocytes Relative: 10 %
Neutro Abs: 4.2 10*3/uL (ref 1.7–7.7)
Neutrophils Relative %: 70 %
Platelets: 241 10*3/uL (ref 150–400)
RBC: 3.98 MIL/uL (ref 3.87–5.11)
RDW: 12.5 % (ref 11.5–15.5)
WBC: 5.8 10*3/uL (ref 4.0–10.5)
nRBC: 0 % (ref 0.0–0.2)

## 2021-11-23 LAB — BASIC METABOLIC PANEL
Anion gap: 10 (ref 5–15)
BUN: 20 mg/dL (ref 8–23)
CO2: 19 mmol/L — ABNORMAL LOW (ref 22–32)
Calcium: 9 mg/dL (ref 8.9–10.3)
Chloride: 109 mmol/L (ref 98–111)
Creatinine, Ser: 0.78 mg/dL (ref 0.44–1.00)
GFR, Estimated: >60 mL/min (ref >60)
Glucose, Bld: 189 mg/dL — ABNORMAL HIGH (ref 70–99)
Potassium: 3.6 mmol/L (ref 3.5–5.1)
Sodium: 138 mmol/L (ref 135–145)

## 2021-11-23 LAB — RESP PANEL BY RT-PCR (FLU A&B, COVID) ARPGX2
Influenza A by PCR: NEGATIVE
Influenza B by PCR: NEGATIVE
SARS Coronavirus 2 by RT PCR: NEGATIVE

## 2021-11-23 MED ORDER — LIDOCAINE-EPINEPHRINE (PF) 2 %-1:200000 IJ SOLN
10.0000 mL | Freq: Once | INTRAMUSCULAR | Status: AC
  Administered 2021-11-23: 10 mL
  Filled 2021-11-23: qty 20

## 2021-11-23 MED ORDER — ONDANSETRON HCL 4 MG/2ML IJ SOLN
4.0000 mg | Freq: Four times a day (QID) | INTRAMUSCULAR | Status: DC | PRN
  Administered 2021-11-23: 21:00:00 4 mg via INTRAVENOUS
  Filled 2021-11-23: qty 2

## 2021-11-23 MED ORDER — FENTANYL CITRATE PF 50 MCG/ML IJ SOSY
50.0000 ug | PREFILLED_SYRINGE | Freq: Once | INTRAMUSCULAR | Status: DC
  Filled 2021-11-23: qty 1

## 2021-11-23 MED ORDER — ONDANSETRON HCL 4 MG PO TABS: 4.0000 mg | ORAL_TABLET | Freq: Four times a day (QID) | ORAL | Status: DC | PRN

## 2021-11-23 MED ORDER — SODIUM CHLORIDE 0.9 % IV SOLN: INTRAVENOUS | Status: DC

## 2021-11-23 NOTE — ED Provider Notes (Signed)
[facility] EMERGENCY DEPARTMENT Provider Note   CSN: [id number] Arrival date & time: 11/23/21  1415     History Chief Complaint  Patient presents with   Lytle Michaels    Rachael Rios is a 70 y.o. female with medical history of hypertension and cataracts.  Patient states that today she was going outside to meet someone that would repair her well when she tripped over her floor mat and fell forward onto her left side.  Patient denies being on blood thinners and denies losing consciousness.  Patient states that she now has left shoulder pain and right wrist pain.  Patient denies headaches, dizziness, confusion, syncopal episode, chest pain, shortness of breath, abdominal pain, fevers.   Fall Pertinent negatives include no chest pain, no abdominal pain, no headaches and no shortness of breath.      Past Medical History:  Diagnosis Date   Cataract    Hypertension    PONV (postoperative nausea and vomiting)     Patient Active Problem List   Diagnosis Date Noted   Special screening for malignant neoplasms, colon    Rotator cuff syndrome of left shoulder 02/25/2012    Past Surgical History:  Procedure Laterality Date   CATARACT EXTRACTION W/PHACO Right 03/26/2015   Procedure: CATARACT EXTRACTION PHACO AND INTRAOCULAR LENS PLACEMENT ([place]);  Surgeon: Tonny Branch, MD;  Location: [facility];  Service: Ophthalmology;  Laterality: Right;  CDE 5.46   CATARACT EXTRACTION W/PHACO Left 04/26/2015   Procedure: CATARACT EXTRACTION PHACO AND INTRAOCULAR LENS PLACEMENT ([facility]);  Surgeon: Tonny Branch, MD;  Location: [facility];  Service: Ophthalmology;  Laterality: Left;  CDE 6.29   COLONOSCOPY N/A 12/07/2015   Procedure: COLONOSCOPY;  Surgeon: Daneil Dolin, MD;  Location: [facility] SUITE;  Service: Endoscopy;  Laterality: N/A;  10:30 AM - moved to 9:30 - office to notify   FINGER SURGERY Left 2000   Left middle finger   HAND SURGERY     INNER EAR SURGERY Bilateral 1977   left hand       OB History   No  obstetric history on file.     Family History  Problem Relation Age of Onset   Diabetes Unknown     Social History   Tobacco Use   Smoking status: Never  Substance Use Topics   Alcohol use: No   Drug use: No    Home Medications Prior to Admission medications   Medication Sig Start Date End Date Taking? Authorizing Provider  calcium carbonate (OSCAL) 1500 (600 CA) MG TABS tablet Take 1,500 mg by mouth 2 (two) times daily with a meal.    Yes [provider]  fish oil-omega-3 fatty acids 1000 MG capsule Take 1 g by mouth daily.    Yes [provider]  levothyroxine (SYNTHROID) 50 MCG tablet Take 50 mcg by mouth daily. 10/07/21  Yes [provider]  losartan (COZAAR) 25 MG tablet Take 25 mg by mouth daily.   Yes [provider]  Multiple Vitamins-Minerals (CENTRUM SILVER PO) Take 1 tablet by mouth daily.    Yes [provider]  alendronate (FOSAMAX) 70 MG tablet Take 70 mg by mouth once a week. Take with a full glass of water on an empty stomach. Takes on Sundays. Patient not taking: Reported on 11/23/2021    [provider]  polyethylene glycol-electrolytes (TRILYTE) 420 G solution Take 4,000 mLs by mouth as directed. Patient not taking: Reported on 11/23/2021 11/15/15   Daneil Dolin, MD  Allergies    Patient has no known allergies.  Review of Systems   Review of Systems  Constitutional:  Negative for fever.  Respiratory:  Negative for shortness of breath.   Cardiovascular:  Negative for chest pain.  Gastrointestinal:  Negative for abdominal pain, diarrhea, nausea and vomiting.  Musculoskeletal:        Pain to left shoulder and right wrist  Skin:  Positive for wound.       2 cm laceration above left eyelid.  Bleeding controlled.  Neurological:  Negative for dizziness, syncope, weakness, light-headedness and headaches.  Psychiatric/Behavioral:  Negative for confusion.   All other systems reviewed and are  negative.  Physical Exam Updated Vital Signs BP 122/79    Pulse (!) 118    Temp 97.6 F (36.4 C) (Oral)    Resp 20    Ht 5\' 5"  (1.651 m)    Wt 50.3 kg    SpO2 95%    BMI 18.47 kg/m   Physical Exam Vitals and nursing note reviewed.  Constitutional:      General: She is not in acute distress.    Appearance: She is normal weight. She is not toxic-appearing.  HENT:     Head: Normocephalic. Laceration present.     Comments: 2cm laceration to left eyelid. Bleeding controlled    Nose: Nose normal.     Mouth/Throat:     Mouth: Mucous membranes are moist.  Eyes:     Extraocular Movements: Extraocular movements intact.     Pupils: Pupils are equal, round, and reactive to light.  Cardiovascular:     Rate and Rhythm: Regular rhythm. Tachycardia present.  Pulmonary:     Effort: Pulmonary effort is normal.     Breath sounds: Normal breath sounds. No wheezing.  Abdominal:     General: Abdomen is flat. There is no distension.     Palpations: Abdomen is soft.     Tenderness: There is no abdominal tenderness.  Musculoskeletal:     Right shoulder: Normal.     Left shoulder: Tenderness present. Decreased range of motion.     Right wrist: Tenderness present. No swelling, deformity or snuff box tenderness. Normal pulse.     Left wrist: Normal. Normal pulse.  Skin:    General: Skin is warm and dry.     Capillary Refill: Capillary refill takes less than 2 seconds.  Neurological:     General: No focal deficit present.     Mental Status: She is alert and oriented to person, place, and time.     GCS: GCS eye subscore is 4. GCS verbal subscore is 5. GCS motor subscore is 6.     Cranial Nerves: Cranial nerves 2-12 are intact.     Sensory: Sensation is intact.     Motor: Motor function is intact. No weakness.     Coordination: Coordination is intact. Finger-Nose-Finger Test and Heel to [place] Test normal.    ED Results / Procedures / Treatments   Labs (all labs ordered are listed, but only  abnormal results are displayed) Labs Reviewed  BASIC METABOLIC PANEL - Abnormal; Notable for the following components:      Result Value   CO2 19 (*)    Glucose, Bld 189 (*)    All other components within normal limits  RESP PANEL BY RT-PCR (FLU A&B, COVID) ARPGX2  CBC WITH DIFFERENTIAL/PLATELET    EKG None  Radiology DG Wrist Complete Right  Result Date: 11/23/2021 CLINICAL DATA:  Fall with pain. EXAM:  RIGHT WRIST - COMPLETE 3+ VIEW COMPARISON:  None. FINDINGS: Four views study shows a comminuted fracture of the distal radial metaphysis with apex anterior angulation. No definite fracture extension to the articular surface evident. Associated minimally displaced ulnar styloid fracture. Bones are diffusely demineralized. IMPRESSION: Comminuted fracture of the distal radial metaphysis with apex anterior angulation. Minimally displaced ulnar styloid fracture. Electronically Signed   By: Misty Stanley M.D.   On: 11/23/2021 15:44   CT Head Wo Contrast  Result Date: 11/23/2021 CLINICAL DATA:  Head trauma EXAM: CT HEAD WITHOUT CONTRAST TECHNIQUE: Contiguous axial images were obtained from the base of the skull through the vertex without intravenous contrast. COMPARISON:  None. FINDINGS: Brain: No evidence of acute infarction, hemorrhage, hydrocephalus, extra-axial collection or mass lesion/mass effect. Mild periventricular white matter hypodensity. Vascular: No hyperdense vessel or unexpected calcification. Skull: Normal. Negative for fracture or focal lesion. Sinuses/Orbits: No acute finding. Other: Soft tissue contusion overlying the left orbit (series 2, image 8). IMPRESSION: 1. No acute intracranial pathology. Small-vessel white matter disease. 2. Soft tissue contusion overlying the left orbit. Electronically Signed   By: Delanna Ahmadi M.D.   On: 11/23/2021 16:21   DG Shoulder Left  Result Date: 11/23/2021 CLINICAL DATA:  Fall, pain EXAM: LEFT SHOULDER - 2+ VIEW COMPARISON:  None. FINDINGS:  Impacted fracture of the surgical neck of the left humerus. Glenohumeral joint is in anatomic apposition. The acromioclavicular joint is preserved. IMPRESSION: Impacted fracture of the surgical neck of the left humerus. Glenohumeral joint is in anatomic apposition. Electronically Signed   By: Delanna Ahmadi M.D.   On: 11/23/2021 15:45    Procedures .Marland KitchenLaceration Repair  Date/Time: 11/23/2021 6:43 PM Performed by: Azucena Cecil, PA-C Authorized by: Azucena Cecil, PA-C   Consent:    Consent obtained:  Verbal   Consent given by:  Patient   Risks, benefits, and alternatives were discussed: yes     Risks discussed:  Infection, pain, need for additional repair and poor cosmetic result   Alternatives discussed:  No treatment Universal protocol:    Patient identity confirmed:  Verbally with patient and arm band Anesthesia:    Anesthesia method:  Local infiltration   Local anesthetic:  Lidocaine 2% WITH epi Laceration details:    Location:  Face   Face location:  L eyebrow   Length (cm):  2 Pre-procedure details:    Preparation:  Patient was prepped and draped in usual sterile fashion Exploration:    Contaminated: no   Treatment:    Area cleansed with:  Saline   Amount of cleaning:  Standard   Irrigation solution:  Sterile saline   Irrigation method:  Syringe Skin repair:    Repair method:  Sutures   Suture size:  6-0   Suture technique:  Simple interrupted   Number of sutures:  3 Approximation:    Approximation:  Close Repair type:    Repair type:  Simple Post-procedure details:    Dressing:  Non-adherent dressing   Procedure completion:  Tolerated well, no immediate complications   Medications Ordered in ED Medications  fentaNYL (SUBLIMAZE) injection 50 mcg (has no administration in time range)  lidocaine-EPINEPHrine (XYLOCAINE W/EPI) 2 %-1:200000 (PF) injection 10 mL (10 mLs Infiltration Given 11/23/21 1549)    ED Course  I have reviewed the triage vital  signs and the nursing notes.  Pertinent labs & imaging results that were available during my care of the patient were reviewed by me and considered in my medical decision making (  see chart for details).    MDM Rules/Calculators/A&P                         70 year old female presents after fall.  Patient complaining of right wrist pain, left shoulder pain with laceration noted above left eye.  Patient assessed with imaging of right wrist and left shoulder as well as head CT.  Laceration repaired per procedure note  Head CT shows no intracranial abnormalities.  Patient right wrist imaged and noted to have comminuted fracture of distal radial metaphysis and minimally displaced styloid ulnar fracture.  Patient left shoulder imaged and noted to have impacted fracture of surgical neck of left humerus.   Patient case discussed with Dr. Burney Gauze who advised to place patient right wrist in sugar-tong splint and sling left shoulder.  This patient lives alone so she will need to be admitted to the hospital for physical therapy and assistance with activities of daily living.  We will consult the hospitalist.  At the time of my shift ending this patient and her case had not been discussed with the hospitalist.  I handed this patient off to Dr. Sabra Heck for consultation with hospitalist and disposition.   Final Clinical Impression(s) / ED Diagnoses Final diagnoses:  Closed torus fracture of distal end of right radius, initial encounter  Closed nondisplaced fracture of surgical neck of left humerus, unspecified fracture morphology, initial encounter    Rx / DC Orders ED Discharge Orders     None        Lawana Chambers 11/23/21 1917    Noemi Chapel, MD 11/23/21 1942

## 2021-11-23 NOTE — H&P (Signed)
TRH H&P    Patient Demographics:    Rachael Rios, is a 70 y.o. female  MRN: [id number]  DOB - 1951-11-14  Admit Date - 11/23/2021  Referring MD/NP/PA: Sabra Heck  Outpatient Primary MD for the patient is Redmond School, MD  Patient coming from: Home  Chief complaint- Fall   HPI:    Rachael Rios  is a 69 y.o. female, with history of hypertension presents ED with a chief complaint of fall.  Patient lives at home alone, and is independent with all ADLs.  Patient reports that she was walking outside when a tarp on her porch was caught in the when and wrapped around her foot.  This tripped patient and she fell off of the porch.  She reached out with her right arm to catch her self, hit the palm of her hand and rolled to her left shoulder and hit her left side of her head on the icy ground.  Patient denies loss of consciousness.  Patient denies any change in vision or change in hearing.  Bystanders called EMS who had to help her get up off the ground.  Patient reports that both arms are painful if she moves them.  She does have sensation in her fingers of both arms.  Patient also has an abrasion on her left shin.  She is not sure how that abrasion got there during this fall, but reports that her hips and pelvis do not hurt.  Patient reports that she has been in her normal state of health.  She has not had any fever, cough, chest pain, palpitations, fatigue, generalized weakness, or other symptoms.  Patient does not smoke, does not drink alcohol, does not use illicit drugs.  She is vaccinated for COVID, flu, and recently had tetanus as well.  Patient is full code.  In the ED Temp 97.6, heart rate 107-118, respiratory rate 18-24, blood pressure 122/79, satting at 95% No leukocytosis at 5.8, hemoglobin 12.8, platelets 241 Chemistry panel reveals a decreased bicarb at 19 but otherwise unremarkable Negative respiratory panel CT  head shows no acute intracranial pathology.  Small vessel white matter disease.  Soft tissue contusion overlying the left orbit Left shoulder shows impacted fracture of the surgical neck of the left humerus.  Glenohumeral joint is in anatomic position Right wrist shows comminuted fracture of the distal radial metaphysis with apex anterior angulation Patient was given fentanyl, started on normal saline, and UA is pending Ortho was consulted and recommends nonemergent Ortho evaluation, admit to any pain to be evaluated by local Ortho  Patient will probably need rehab.  But there is a niece involved who may be able to help take care of her for rehab/home health at home.    Review of systems:    In addition to the HPI above,  No Fever-chills, No Headache, No changes with Vision or hearing, No problems swallowing food or Liquids, No Chest pain, Cough or Shortness of Breath, No Abdominal pain, No Nausea or Vomiting, bowel movements are regular, No Blood in stool or Urine, No dysuria,  No new skin rashes or bruises, No new joints pains-aches,  No new weakness, tingling, numbness in any extremity, No recent weight gain or loss, No polyuria, polydypsia or polyphagia, No significant Mental Stressors.  All other systems reviewed and are negative.    Past History of the following :    Past Medical History:  Diagnosis Date   Cataract    Hypertension    PONV (postoperative nausea and vomiting)       Past Surgical History:  Procedure Laterality Date   CATARACT EXTRACTION W/PHACO Right 03/26/2015   Procedure: CATARACT EXTRACTION PHACO AND INTRAOCULAR LENS PLACEMENT ([place]);  Surgeon: Tonny Branch, MD;  Location: [facility];  Service: Ophthalmology;  Laterality: Right;  CDE 5.46   CATARACT EXTRACTION W/PHACO Left 04/26/2015   Procedure: CATARACT EXTRACTION PHACO AND INTRAOCULAR LENS PLACEMENT ([facility]);  Surgeon: Tonny Branch, MD;  Location: [facility];  Service: Ophthalmology;  Laterality: Left;  CDE 6.29    COLONOSCOPY N/A 12/07/2015   Procedure: COLONOSCOPY;  Surgeon: Daneil Dolin, MD;  Location: [facility];  Service: Endoscopy;  Laterality: N/A;  10:30 AM - moved to 9:30 - office to notify   FINGER SURGERY Left 2000   Left middle finger   HAND SURGERY     INNER EAR SURGERY Bilateral 1977   left hand        Social History:      Social History   Tobacco Use   Smoking status: Never   Smokeless tobacco: Not on file  Substance Use Topics   Alcohol use: No       Family History :     Family History  Problem Relation Age of Onset   Diabetes Unknown       Home Medications:   Prior to Admission medications   Medication Sig Start Date End Date Taking? Authorizing Provider  calcium carbonate (OSCAL) 1500 (600 CA) MG TABS tablet Take 1,500 mg by mouth 2 (two) times daily with a meal.    Yes [provider]  fish oil-omega-3 fatty acids 1000 MG capsule Take 1 g by mouth daily.    Yes [provider]  levothyroxine (SYNTHROID) 50 MCG tablet Take 50 mcg by mouth daily. 10/07/21  Yes [provider]  losartan (COZAAR) 25 MG tablet Take 25 mg by mouth daily.   Yes [provider]  Multiple Vitamins-Minerals (CENTRUM SILVER PO) Take 1 tablet by mouth daily.    Yes [provider]  alendronate (FOSAMAX) 70 MG tablet Take 70 mg by mouth once a week. Take with a full glass of water on an empty stomach. Takes on Sundays. Patient not taking: Reported on 11/23/2021    [provider]  polyethylene glycol-electrolytes (TRILYTE) 420 G solution Take 4,000 mLs by mouth as directed. Patient not taking: Reported on 11/23/2021 11/15/15   Daneil Dolin, MD     Allergies:    No Known Allergies   Physical Exam:   Vitals  Blood pressure 116/68, pulse (!) 106, temperature 97.6 F (36.4 C), temperature source Oral, resp. rate 17, height 5\' 5"  (1.651 m), weight 50.3 kg, SpO2 95 %.   1.  General: Patient lying supine in bed,  no acute  distress   2. Psychiatric: Alert and oriented x 3, mood and behavior normal for situation, pleasant and cooperative with exam   3. Neurologic: Speech and language are normal, face is symmetric, moves all 4 extremities voluntarily, at baseline without acute deficits on limited exam   4. HEENMT:  Head is with abrasion and hematoma around left eye, normocephalic, pupils reactive to light, neck is supple, trachea is midline, mucous membranes are moist   5. Respiratory : Lungs are clear to auscultation bilaterally without wheezing, rhonchi, rales, no cyanosis, no increase in work of breathing or accessory muscle use   6. Cardiovascular : Heart rate normal, rhythm is regular, no murmurs, rubs or gallops, no peripheral edema, peripheral pulses palpated   7. Gastrointestinal:  Abdomen is soft, nondistended, nontender to palpation bowel sounds active, no masses or organomegaly palpated   8. Skin:  Skin is warm, dry and intact without rashes, acute lesions, or ulcers on limited exam   9.Musculoskeletal:  Right lower extremity has abrasion on the anterior shin, left shoulder is bruised and swollen, right wrist is in a splint and sling     Data Review:    CBC Recent Labs  Lab 11/23/21 1519  WBC 5.8  HGB 12.8  HCT 36.0  PLT 241  MCV 90.5  MCH 32.2  MCHC 35.6  RDW 12.5  LYMPHSABS 1.0  MONOABS 0.6  EOSABS 0.0  BASOSABS 0.0   ------------------------------------------------------------------------------------------------------------------  Results for orders placed or performed during the hospital encounter of 11/23/21 (from the past 48 hour(s))  CBC with Differential     Status: None   Collection Time: 11/23/21  3:19 PM  Result Value Ref Range   WBC 5.8 4.0 - 10.5 K/uL   RBC 3.98 3.87 - 5.11 MIL/uL   Hemoglobin 12.8 12.0 - 15.0 g/dL   HCT 36.0 36.0 - 46.0 %   MCV 90.5 80.0 - 100.0 fL   MCH 32.2 26.0 - 34.0 pg   MCHC 35.6 30.0 - 36.0 g/dL   RDW 12.5 11.5 - 15.5 %    Platelets 241 150 - 400 K/uL   nRBC 0.0 0.0 - 0.2 %   Neutrophils Relative % 70 %   Neutro Abs 4.2 1.7 - 7.7 K/uL   Lymphocytes Relative 17 %   Lymphs Abs 1.0 0.7 - 4.0 K/uL   Monocytes Relative 10 %   Monocytes Absolute 0.6 0.1 - 1.0 K/uL   Eosinophils Relative 1 %   Eosinophils Absolute 0.0 0.0 - 0.5 K/uL   Basophils Relative 1 %   Basophils Absolute 0.0 0.0 - 0.1 K/uL   Immature Granulocytes 1 %   Abs Immature Granulocytes 0.03 0.00 - 0.07 K/uL    Comment: Performed at [facility], [address] [place], [place] [address]  Basic metabolic panel     Status: Abnormal   Collection Time: 11/23/21  3:19 PM  Result Value Ref Range   Sodium 138 135 - 145 mmol/L   Potassium 3.6 3.5 - 5.1 mmol/L   Chloride 109 98 - 111 mmol/L   CO2 19 (L) 22 - 32 mmol/L   Glucose, Bld 189 (H) 70 - 99 mg/dL    Comment: Glucose reference range applies only to samples taken after fasting for at least 8 hours.   BUN 20 8 - 23 mg/dL   Creatinine, Ser 0.78 0.44 - 1.00 mg/dL   Calcium 9.0 8.9 - 10.3 mg/dL   GFR, Estimated >60 >60 mL/min    Comment: (NOTE) Calculated using the CKD-EPI Creatinine Equation (2021)    Anion gap 10 5 - 15    Comment: Performed at [facility], [address] [place], [place] [address]  Resp Panel by RT-PCR (Flu A&B, Covid) Nasopharyngeal Swab     Status: None   Collection Time: 11/23/21  5:28  PM   Specimen: Nasopharyngeal Swab; Nasopharyngeal(NP) swabs in vial transport medium  Result Value Ref Range   SARS Coronavirus 2 by RT PCR NEGATIVE NEGATIVE    Comment: (NOTE) SARS-CoV-2 target nucleic acids are NOT DETECTED.  The SARS-CoV-2 RNA is generally detectable in upper respiratory specimens during the acute phase of infection. The lowest concentration of SARS-CoV-2 viral copies this assay can detect is 138 copies/mL. A negative result does not preclude SARS-Cov-2 infection and should not be used as the sole basis for treatment or other patient management decisions. A  negative result may occur with  improper specimen collection/handling, submission of specimen other than nasopharyngeal swab, presence of viral mutation(s) within the areas targeted by this assay, and inadequate number of viral copies(<138 copies/mL). A negative result must be combined with clinical observations, patient history, and epidemiological information. The expected result is Negative.  Fact Sheet for Patients:  EntrepreneurPulse.com.au  Fact Sheet for Healthcare Providers:  IncredibleEmployment.be  This test is no t yet approved or cleared by the [place] FDA and  has been authorized for detection and/or diagnosis of SARS-CoV-2 by FDA under an Emergency Use Authorization (EUA). This EUA will remain  in effect (meaning this test can be used) for the duration of the COVID-19 declaration under Section 564(b)(1) of the Act, 21 U.S.C.section 360bbb-3(b)(1), unless the authorization is terminated  or revoked sooner.       Influenza A by PCR NEGATIVE NEGATIVE   Influenza B by PCR NEGATIVE NEGATIVE    Comment: (NOTE) The Xpert Xpress SARS-CoV-2/FLU/RSV plus assay is intended as an aid in the diagnosis of influenza from Nasopharyngeal swab specimens and should not be used as a sole basis for treatment. Nasal washings and aspirates are unacceptable for Xpert Xpress SARS-CoV-2/FLU/RSV testing.  Fact Sheet for Patients: EntrepreneurPulse.com.au  Fact Sheet for Healthcare Providers: IncredibleEmployment.be  This test is not yet approved or cleared by the [place] FDA and has been authorized for detection and/or diagnosis of SARS-CoV-2 by FDA under an Emergency Use Authorization (EUA). This EUA will remain in effect (meaning this test can be used) for the duration of the COVID-19 declaration under Section 564(b)(1) of the Act, 21 U.S.C. section 360bbb-3(b)(1), unless the authorization is terminated  or revoked.  Performed at [facility], [address], [place], [place] [address]     Chemistries  Recent Labs  Lab 11/23/21 1519  NA 138  K 3.6  CL 109  CO2 19*  GLUCOSE 189*  BUN 20  CREATININE 0.78  CALCIUM 9.0   ------------------------------------------------------------------------------------------------------------------  ------------------------------------------------------------------------------------------------------------------ GFR: Estimated Creatinine Clearance: 50.5 mL/min (by C-G formula based on SCr of 0.78 mg/dL). Liver Function Tests: No results for input(s): AST, ALT, ALKPHOS, BILITOT, PROT, ALBUMIN in the last 168 hours. No results for input(s): LIPASE, AMYLASE in the last 168 hours. No results for input(s): AMMONIA in the last 168 hours. Coagulation Profile: No results for input(s): INR, PROTIME in the last 168 hours. Cardiac Enzymes: No results for input(s): CKTOTAL, CKMB, CKMBINDEX, TROPONINI in the last 168 hours. BNP (last 3 results) No results for input(s): PROBNP in the last 8760 hours. HbA1C: No results for input(s): HGBA1C in the last 72 hours. CBG: No results for input(s): GLUCAP in the last 168 hours. Lipid Profile: No results for input(s): CHOL, HDL, LDLCALC, TRIG, CHOLHDL, LDLDIRECT in the last 72 hours. Thyroid Function Tests: No results for input(s): TSH, T4TOTAL, FREET4, T3FREE, THYROIDAB in the last 72 hours. Anemia Panel: No results for input(s): VITAMINB12, FOLATE, FERRITIN, TIBC,  IRON, RETICCTPCT in the last 72 hours.  --------------------------------------------------------------------------------------------------------------- Urine analysis: No results found for: COLORURINE, APPEARANCEUR, LABSPEC, PHURINE, GLUCOSEU, HGBUR, BILIRUBINUR, KETONESUR, PROTEINUR, UROBILINOGEN, NITRITE, LEUKOCYTESUR    Imaging Results:    DG Wrist Complete Right  Result Date: 11/23/2021 CLINICAL DATA:  Fall with pain. EXAM: RIGHT WRIST -  COMPLETE 3+ VIEW COMPARISON:  None. FINDINGS: Four views study shows a comminuted fracture of the distal radial metaphysis with apex anterior angulation. No definite fracture extension to the articular surface evident. Associated minimally displaced ulnar styloid fracture. Bones are diffusely demineralized. IMPRESSION: Comminuted fracture of the distal radial metaphysis with apex anterior angulation. Minimally displaced ulnar styloid fracture. Electronically Signed   By: Misty Stanley M.D.   On: 11/23/2021 15:44   CT Head Wo Contrast  Result Date: 11/23/2021 CLINICAL DATA:  Head trauma EXAM: CT HEAD WITHOUT CONTRAST TECHNIQUE: Contiguous axial images were obtained from the base of the skull through the vertex without intravenous contrast. COMPARISON:  None. FINDINGS: Brain: No evidence of acute infarction, hemorrhage, hydrocephalus, extra-axial collection or mass lesion/mass effect. Mild periventricular white matter hypodensity. Vascular: No hyperdense vessel or unexpected calcification. Skull: Normal. Negative for fracture or focal lesion. Sinuses/Orbits: No acute finding. Other: Soft tissue contusion overlying the left orbit (series 2, image 8). IMPRESSION: 1. No acute intracranial pathology. Small-vessel white matter disease. 2. Soft tissue contusion overlying the left orbit. Electronically Signed   By: Delanna Ahmadi M.D.   On: 11/23/2021 16:21   DG Shoulder Left  Result Date: 11/23/2021 CLINICAL DATA:  Fall, pain EXAM: LEFT SHOULDER - 2+ VIEW COMPARISON:  None. FINDINGS: Impacted fracture of the surgical neck of the left humerus. Glenohumeral joint is in anatomic apposition. The acromioclavicular joint is preserved. IMPRESSION: Impacted fracture of the surgical neck of the left humerus. Glenohumeral joint is in anatomic apposition. Electronically Signed   By: Delanna Ahmadi M.D.   On: 11/23/2021 15:45       Assessment & Plan:    Principal Problem:   Fall Active Problems:   Humerus fracture    Wrist fracture   Essential hypertension   Mechanical fall Tarp prep from foot causing trip and fall Multiple fractures Ortho consulted and recommends nonemergent evaluation with local Ortho, admit to [facility] Pain control PT eval and treat TOC consult for either home health or rehab placement Continue to monitor Humerus fracture PT eval and treat Non emergent ortho consult Pain control Wrist fracture PT eval and treat Non emergent ortho consult Pain control Essential hypertension Continue Cozaar Hypothyroid Continue synthroid   DVT Prophylaxis-   Heparin - SCDs   AM Labs Ordered, also please review Full Orders  Family Communication: No family at bedside  Code Status:  Full  Admission status: Inpatient :The appropriate admission status for this patient is INPATIENT. Inpatient status is judged to be reasonable and necessary in order to provide the required intensity of service to ensure the patient's safety. The patient's presenting symptoms, physical exam findings, and initial radiographic and laboratory data in the context of their chronic comorbidities is felt to place them at high risk for further clinical deterioration. Furthermore, it is not anticipated that the patient will be medically stable for discharge from the hospital within 2 midnights of admission. The following factors support the admission status of inpatient.     The patient's presenting symptoms include Mechanical fall. The worrisome physical exam findings include bruising, abrasions. The initial radiographic and laboratory data are worrisome because of multiple fractures. The chronic co-morbidities include  HTN, Hypothyroid.       * I certify that at the point of admission it is my clinical judgment that the patient will require inpatient hospital care spanning beyond 2 midnights from the point of admission due to high intensity of service, high risk for further deterioration and high frequency of  surveillance required.*  Disposition: Anticipated Discharge date 48-72hrs for possible placement Discharge to Rehab vs home health  Time spent in minutes : [place]

## 2021-11-23 NOTE — ED Notes (Signed)
Patient transported to CT 

## 2021-11-23 NOTE — ED Triage Notes (Signed)
Patient brought in by [facility] for fall. Complaints of left should pain and right wrist pain. Patient has a laceration to the eyebrow.

## 2021-11-23 NOTE — ED Provider Notes (Signed)
This unfortunate 70 year old female who lives by herself had an accidental fall falling on her left and right arms, she suffered pain in her left shoulder as well as pain in the right wrist and injured the left side of her periorbital tissue causing a laceration.  On my evaluation of the x-rays she does in fact have a comminuted distal radial fracture as well as a proximal humeral fracture on the left.  She has no signs of intracranial hematoma or fracture of the face.  She will need a primary repair of the laceration of her face which the physician assistant will perform, she will be immobilized with a sling on the left arm and a sugar-tong splint on the right arm.  Unfortunately she will likely need to be placed as family members are not able to help take care of her at this time.  Ultimately the patient had her laceration repaired, I supervised the repair of this laceration directly.  She has bilateral arm fractures of the left humerus and the right wrist.  She will not be able to use her arms which are both in slings, she will be admitted to her local, appreciate hospitalist for coming to admit.  I did discuss the case with hand surgery, Dr. Burney Gauze who agrees that this can be handled nonurgently with regards to repair of wrist.  Medical screening examination/treatment/procedure(s) were conducted as a shared visit with non-physician practitioner(s) and myself.  I personally evaluated the patient during the encounter.  Clinical Impression:   Final diagnoses:  Closed torus fracture of distal end of right radius, initial encounter  Closed nondisplaced fracture of surgical neck of left humerus, unspecified fracture morphology, initial encounter  Facial laceration, initial encounter  Minor head injury, initial encounter         Noemi Chapel, MD 11/23/21 1942

## 2021-11-24 DIAGNOSIS — S62101A Fracture of unspecified carpal bone, right wrist, initial encounter for closed fracture: Secondary | ICD-10-CM | POA: Diagnosis not present

## 2021-11-24 DIAGNOSIS — W19XXXA Unspecified fall, initial encounter: Secondary | ICD-10-CM | POA: Diagnosis not present

## 2021-11-24 DIAGNOSIS — I10 Essential (primary) hypertension: Secondary | ICD-10-CM | POA: Diagnosis not present

## 2021-11-24 DIAGNOSIS — S42215A Unspecified nondisplaced fracture of surgical neck of left humerus, initial encounter for closed fracture: Secondary | ICD-10-CM | POA: Diagnosis not present

## 2021-11-24 LAB — COMPREHENSIVE METABOLIC PANEL
ALT: 58 U/L — ABNORMAL HIGH (ref 0–44)
AST: 65 U/L — ABNORMAL HIGH (ref 15–41)
Albumin: 3.3 g/dL — ABNORMAL LOW (ref 3.5–5.0)
Alkaline Phosphatase: 56 U/L (ref 38–126)
Anion gap: 11 (ref 5–15)
BUN: 14 mg/dL (ref 8–23)
CO2: 16 mmol/L — ABNORMAL LOW (ref 22–32)
Calcium: 8.1 mg/dL — ABNORMAL LOW (ref 8.9–10.3)
Chloride: 113 mmol/L — ABNORMAL HIGH (ref 98–111)
Creatinine, Ser: 0.6 mg/dL (ref 0.44–1.00)
GFR, Estimated: >60 mL/min (ref >60)
Glucose, Bld: 150 mg/dL — ABNORMAL HIGH (ref 70–99)
Potassium: 3.8 mmol/L (ref 3.5–5.1)
Sodium: 140 mmol/L (ref 135–145)
Total Bilirubin: 0.4 mg/dL (ref 0.3–1.2)
Total Protein: 5.7 g/dL — ABNORMAL LOW (ref 6.5–8.1)

## 2021-11-24 LAB — TSH: TSH: 3.367 u[IU]/mL (ref 0.350–4.500)

## 2021-11-24 LAB — CK: Total CK: 150 U/L (ref 38–234)

## 2021-11-24 LAB — CBC
HCT: 29.8 % — ABNORMAL LOW (ref 36.0–46.0)
Hemoglobin: 9.9 g/dL — ABNORMAL LOW (ref 12.0–15.0)
MCH: 30.9 pg (ref 26.0–34.0)
MCHC: 33.2 g/dL (ref 30.0–36.0)
MCV: 93.1 fL (ref 80.0–100.0)
Platelets: 162 10*3/uL (ref 150–400)
RBC: 3.2 MIL/uL — ABNORMAL LOW (ref 3.87–5.11)
RDW: 12.6 % (ref 11.5–15.5)
WBC: 6.2 10*3/uL (ref 4.0–10.5)
nRBC: 0 % (ref 0.0–0.2)

## 2021-11-24 LAB — MAGNESIUM: Magnesium: 1.9 mg/dL (ref 1.7–2.4)

## 2021-11-24 LAB — VITAMIN D 25 HYDROXY (VIT D DEFICIENCY, FRACTURES): Vit D, 25-Hydroxy: 70.15 ng/mL (ref 30–100)

## 2021-11-24 MED ORDER — DM-GUAIFENESIN ER 30-600 MG PO TB12: 1.0000 | ORAL_TABLET | Freq: Two times a day (BID) | ORAL | Status: DC | PRN

## 2021-11-24 MED ORDER — SODIUM CHLORIDE 0.9 % IV SOLN: INTRAVENOUS | Status: AC

## 2021-11-24 MED ORDER — LOSARTAN POTASSIUM 50 MG PO TABS
25.0000 mg | ORAL_TABLET | Freq: Every day | ORAL | Status: DC
  Administered 2021-11-24 – 2021-11-26 (×3): 25 mg via ORAL
  Filled 2021-11-24 (×3): qty 1

## 2021-11-24 MED ORDER — OMEGA-3-ACID ETHYL ESTERS 1 G PO CAPS
1.0000 g | ORAL_CAPSULE | Freq: Every day | ORAL | Status: DC
  Administered 2021-11-24 – 2021-11-26 (×3): 1 g via ORAL
  Filled 2021-11-24 (×3): qty 1

## 2021-11-24 MED ORDER — SENNOSIDES-DOCUSATE SODIUM 8.6-50 MG PO TABS
1.0000 | ORAL_TABLET | Freq: Every day | ORAL | Status: DC
  Administered 2021-11-25: 22:00:00 1 via ORAL
  Filled 2021-11-24 (×2): qty 1

## 2021-11-24 MED ORDER — HEPARIN SODIUM (PORCINE) 5000 UNIT/ML IJ SOLN
5000.0000 [IU] | Freq: Three times a day (TID) | INTRAMUSCULAR | Status: DC
  Administered 2021-11-24 – 2021-11-26 (×5): 5000 [IU] via SUBCUTANEOUS
  Filled 2021-11-24 (×6): qty 1

## 2021-11-24 MED ORDER — MORPHINE SULFATE (PF) 2 MG/ML IV SOLN: 2.0000 mg | INTRAVENOUS | Status: DC | PRN

## 2021-11-24 MED ORDER — METOPROLOL TARTRATE 5 MG/5ML IV SOLN: 5.0000 mg | INTRAVENOUS | Status: DC | PRN

## 2021-11-24 MED ORDER — OMEGA-3 FATTY ACIDS 1000 MG PO CAPS: 1.0000 g | ORAL_CAPSULE | Freq: Every day | ORAL | Status: DC

## 2021-11-24 MED ORDER — CALCIUM CARBONATE 1250 (500 CA) MG PO TABS
1250.0000 mg | ORAL_TABLET | Freq: Two times a day (BID) | ORAL | Status: DC
  Administered 2021-11-24 – 2021-11-26 (×6): 1250 mg via ORAL
  Filled 2021-11-24 (×6): qty 1

## 2021-11-24 MED ORDER — OXYCODONE HCL 5 MG PO TABS
5.0000 mg | ORAL_TABLET | ORAL | Status: DC | PRN
  Administered 2021-11-24 (×3): 5 mg via ORAL
  Filled 2021-11-24 (×5): qty 1

## 2021-11-24 MED ORDER — HYDRALAZINE HCL 20 MG/ML IJ SOLN: 10.0000 mg | INTRAMUSCULAR | Status: DC | PRN

## 2021-11-24 MED ORDER — POLYETHYLENE GLYCOL 3350 17 G PO PACK: 17.0000 g | PACK | Freq: Every day | ORAL | Status: DC | PRN

## 2021-11-24 MED ORDER — LEVOTHYROXINE SODIUM 50 MCG PO TABS
50.0000 ug | ORAL_TABLET | Freq: Every day | ORAL | Status: DC
Start: 2021-11-24 — End: 2021-11-27
  Administered 2021-11-24 – 2021-11-26 (×3): 50 ug via ORAL
  Filled 2021-11-24 (×3): qty 1

## 2021-11-24 MED ORDER — TRAZODONE HCL 50 MG PO TABS: 50.0000 mg | ORAL_TABLET | Freq: Every evening | ORAL | Status: DC | PRN

## 2021-11-24 MED ORDER — IPRATROPIUM-ALBUTEROL 0.5-2.5 (3) MG/3ML IN SOLN: 3.0000 mL | RESPIRATORY_TRACT | Status: DC | PRN

## 2021-11-24 MED ORDER — ACETAMINOPHEN 650 MG RE SUPP: 650.0000 mg | Freq: Four times a day (QID) | RECTAL | Status: DC | PRN

## 2021-11-24 MED ORDER — ACETAMINOPHEN 325 MG PO TABS: 650.0000 mg | ORAL_TABLET | Freq: Four times a day (QID) | ORAL | Status: DC | PRN

## 2021-11-24 NOTE — Progress Notes (Signed)
PROGRESS NOTE    Rachael Rios  HQI:[id number] DOB: February 02, 1951 DOA: 11/23/2021 PCP: Redmond School, MD   Brief Narrative:   70 year old with history of hypertension comes to the hospital after sustaining a fall at home.  Typically she lives at home alone and is independent.  Denies any loss of consciousness.  In the ED CT of the head was negative, left shoulder showed impacted fracture of the surgical neck of the humerus, right wrist shows comminuted fracture of the distal radius metaphysis with apex anterior angulation.  Orthopedic recommended no urgent evaluation and admitted for pain control, PT OT evaluation.  Assessment & Plan:   Principal Problem:   Fall Active Problems:   Humerus fracture   Wrist fracture   Essential hypertension   Mechanical fall Left shoulder and right wrist fracture - Consulted orthopedic by the ED, Dr. Burney Gauze who recommended sugar-tong splint for the right wrist and sling for the left shoulder - Pain control, bowel regimen.  PT/OT. - CK- WNL, vitamin D levels  Essential hypertension - On Cozaar  Hypothyroidism - Synthroid  Moderate protein calorie malnutrition - Dietitian consult    DVT prophylaxis: Subcu heparin Code Status: Full code Family Communication:  Called Tina; no answer  Status is: Inpatient  Remains inpatient appropriate because: Still has severe pain with any movement, needs PT/OT and likely rehab Patient was functioning independently prior to this fall causing fracture of her shoulder and wrist.      Subjective: Patient states she lives at home alone and is independent and was working outside yesterday when she suffered from fall causing shoulder and wrist injury.  This morning she is having very difficult time moving due to pain and also her arms being in a sling.    Examination:  General exam: Appears calm and comfortable, elderly frail Respiratory system: Clear to auscultation. Respiratory effort  normal. Cardiovascular system: S1 & S2 heard, RRR. No JVD, murmurs, rubs, gallops or clicks. No pedal edema. Gastrointestinal system: Abdomen is nondistended, soft and nontender. No organomegaly or masses felt. Normal bowel sounds heard. Central nervous system: Alert and oriented. No focal neurological deficits. Extremities: Bilateral upper extremities are in sling. Skin: No rashes, lesions or ulcers Psychiatry: Judgement and insight appear normal. Mood & affect appropriate.     Objective: Vitals:   11/24/21 0000 11/24/21 0100 11/24/21 0250 11/24/21 0639  BP: 109/71 127/64 120/60 (!) 122/58  Pulse:   (!) 103 (!) 101  Resp: (!) 24 17 20 18   Temp:   98.6 F (37 C) 98.5 F (36.9 C)  TempSrc:   Oral Oral  SpO2:   98% 97%  Weight:      Height:        Intake/Output Summary (Last 24 hours) at 11/24/2021 0755 Last data filed at 11/24/2021 0500 Gross per 24 hour  Intake 1101.32 ml  Output --  Net 1101.32 ml   Filed Weights   11/23/21 1442  Weight: 50.3 kg     Data Reviewed:   CBC: Recent Labs  Lab 11/23/21 1519 11/24/21 0404  WBC 5.8 6.2  NEUTROABS 4.2  --   HGB 12.8 9.9*  HCT 36.0 29.8*  MCV 90.5 93.1  PLT 241 132   Basic Metabolic Panel: Recent Labs  Lab 11/23/21 1519 11/24/21 0404  NA 138 140  K 3.6 3.8  CL 109 113*  CO2 19* 16*  GLUCOSE 189* 150*  BUN 20 14  CREATININE 0.78 0.60  CALCIUM 9.0 8.1*  MG  --  1.9  GFR: Estimated Creatinine Clearance: 50.5 mL/min (by C-G formula based on SCr of 0.6 mg/dL). Liver Function Tests: Recent Labs  Lab 11/24/21 0404  AST 65*  ALT 58*  ALKPHOS 56  BILITOT 0.4  PROT 5.7*  ALBUMIN 3.3*   No results for input(s): LIPASE, AMYLASE in the last 168 hours. No results for input(s): AMMONIA in the last 168 hours. Coagulation Profile: No results for input(s): INR, PROTIME in the last 168 hours. Cardiac Enzymes: No results for input(s): CKTOTAL, CKMB, CKMBINDEX, TROPONINI in the last 168 hours. BNP (last 3  results) No results for input(s): PROBNP in the last 8760 hours. HbA1C: No results for input(s): HGBA1C in the last 72 hours. CBG: No results for input(s): GLUCAP in the last 168 hours. Lipid Profile: No results for input(s): CHOL, HDL, LDLCALC, TRIG, CHOLHDL, LDLDIRECT in the last 72 hours. Thyroid Function Tests: No results for input(s): TSH, T4TOTAL, FREET4, T3FREE, THYROIDAB in the last 72 hours. Anemia Panel: No results for input(s): VITAMINB12, FOLATE, FERRITIN, TIBC, IRON, RETICCTPCT in the last 72 hours. Sepsis Labs: No results for input(s): PROCALCITON, LATICACIDVEN in the last 168 hours.  Recent Results (from the past 240 hour(s))  Resp Panel by RT-PCR (Flu A&B, Covid) Nasopharyngeal Swab     Status: None   Collection Time: 11/23/21  5:28 PM   Specimen: Nasopharyngeal Swab; Nasopharyngeal(NP) swabs in vial transport medium  Result Value Ref Range Status   SARS Coronavirus 2 by RT PCR NEGATIVE NEGATIVE Final    Comment: (NOTE) SARS-CoV-2 target nucleic acids are NOT DETECTED.  The SARS-CoV-2 RNA is generally detectable in upper respiratory specimens during the acute phase of infection. The lowest concentration of SARS-CoV-2 viral copies this assay can detect is 138 copies/mL. A negative result does not preclude SARS-Cov-2 infection and should not be used as the sole basis for treatment or other patient management decisions. A negative result may occur with  improper specimen collection/handling, submission of specimen other than nasopharyngeal swab, presence of viral mutation(s) within the areas targeted by this assay, and inadequate number of viral copies(<138 copies/mL). A negative result must be combined with clinical observations, patient history, and epidemiological information. The expected result is Negative.  Fact Sheet for Patients:  EntrepreneurPulse.com.au  Fact Sheet for Healthcare Providers:   IncredibleEmployment.be  This test is no t yet approved or cleared by the [place] FDA and  has been authorized for detection and/or diagnosis of SARS-CoV-2 by FDA under an Emergency Use Authorization (EUA). This EUA will remain  in effect (meaning this test can be used) for the duration of the COVID-19 declaration under Section 564(b)(1) of the Act, 21 U.S.C.section 360bbb-3(b)(1), unless the authorization is terminated  or revoked sooner.       Influenza A by PCR NEGATIVE NEGATIVE Final   Influenza B by PCR NEGATIVE NEGATIVE Final    Comment: (NOTE) The Xpert Xpress SARS-CoV-2/FLU/RSV plus assay is intended as an aid in the diagnosis of influenza from Nasopharyngeal swab specimens and should not be used as a sole basis for treatment. Nasal washings and aspirates are unacceptable for Xpert Xpress SARS-CoV-2/FLU/RSV testing.  Fact Sheet for Patients: EntrepreneurPulse.com.au  Fact Sheet for Healthcare Providers: IncredibleEmployment.be  This test is not yet approved or cleared by the [place] FDA and has been authorized for detection and/or diagnosis of SARS-CoV-2 by FDA under an Emergency Use Authorization (EUA). This EUA will remain in effect (meaning this test can be used) for the duration of the COVID-19 declaration under Section 564(b)(1) of  the Act, 21 U.S.C. section 360bbb-3(b)(1), unless the authorization is terminated or revoked.  Performed at [facility], [address], [place], [place] [address]          Radiology Studies: DG Wrist Complete Right  Result Date: 11/23/2021 CLINICAL DATA:  Fall with pain. EXAM: RIGHT WRIST - COMPLETE 3+ VIEW COMPARISON:  None. FINDINGS: Four views study shows a comminuted fracture of the distal radial metaphysis with apex anterior angulation. No definite fracture extension to the articular surface evident. Associated minimally displaced ulnar styloid fracture.  Bones are diffusely demineralized. IMPRESSION: Comminuted fracture of the distal radial metaphysis with apex anterior angulation. Minimally displaced ulnar styloid fracture. Electronically Signed   By: Misty Stanley M.D.   On: 11/23/2021 15:44   CT Head Wo Contrast  Result Date: 11/23/2021 CLINICAL DATA:  Head trauma EXAM: CT HEAD WITHOUT CONTRAST TECHNIQUE: Contiguous axial images were obtained from the base of the skull through the vertex without intravenous contrast. COMPARISON:  None. FINDINGS: Brain: No evidence of acute infarction, hemorrhage, hydrocephalus, extra-axial collection or mass lesion/mass effect. Mild periventricular white matter hypodensity. Vascular: No hyperdense vessel or unexpected calcification. Skull: Normal. Negative for fracture or focal lesion. Sinuses/Orbits: No acute finding. Other: Soft tissue contusion overlying the left orbit (series 2, image 8). IMPRESSION: 1. No acute intracranial pathology. Small-vessel white matter disease. 2. Soft tissue contusion overlying the left orbit. Electronically Signed   By: Delanna Ahmadi M.D.   On: 11/23/2021 16:21   DG Shoulder Left  Result Date: 11/23/2021 CLINICAL DATA:  Fall, pain EXAM: LEFT SHOULDER - 2+ VIEW COMPARISON:  None. FINDINGS: Impacted fracture of the surgical neck of the left humerus. Glenohumeral joint is in anatomic apposition. The acromioclavicular joint is preserved. IMPRESSION: Impacted fracture of the surgical neck of the left humerus. Glenohumeral joint is in anatomic apposition. Electronically Signed   By: Delanna Ahmadi M.D.   On: 11/23/2021 15:45        Scheduled Meds:  calcium carbonate  1,250 mg Oral BID WC   fentaNYL (SUBLIMAZE) injection  50 mcg Intravenous Once   heparin  5,000 Units Subcutaneous Q8H   levothyroxine  50 mcg Oral Daily   losartan  25 mg Oral Daily   omega-3 acid ethyl esters  1 g Oral Daily   Continuous Infusions:  sodium chloride 150 mL/hr at 11/24/21 0312     LOS: 1 day    Time spent= 35 mins    Cosette Prindle Arsenio Loader, MD [facility]  If 7PM-7AM, please contact night-coverage  11/24/2021, 7:55 AM

## 2021-11-24 NOTE — Plan of Care (Signed)
  Problem: Education: Goal: Knowledge of General Education information will improve Description: Including pain rating scale, medication(s)/side effects and non-pharmacologic comfort measures Outcome: Progressing   Problem: Clinical Measurements: Goal: Ability to maintain clinical measurements within normal limits will improve Outcome: Progressing   

## 2021-11-25 DIAGNOSIS — S62101A Fracture of unspecified carpal bone, right wrist, initial encounter for closed fracture: Secondary | ICD-10-CM | POA: Diagnosis not present

## 2021-11-25 DIAGNOSIS — I10 Essential (primary) hypertension: Secondary | ICD-10-CM | POA: Diagnosis not present

## 2021-11-25 DIAGNOSIS — W19XXXA Unspecified fall, initial encounter: Secondary | ICD-10-CM | POA: Diagnosis not present

## 2021-11-25 DIAGNOSIS — S42215A Unspecified nondisplaced fracture of surgical neck of left humerus, initial encounter for closed fracture: Secondary | ICD-10-CM | POA: Diagnosis not present

## 2021-11-25 LAB — CBC
HCT: 30 % — ABNORMAL LOW (ref 36.0–46.0)
Hemoglobin: 9.9 g/dL — ABNORMAL LOW (ref 12.0–15.0)
MCH: 31 pg (ref 26.0–34.0)
MCHC: 33 g/dL (ref 30.0–36.0)
MCV: 94 fL (ref 80.0–100.0)
Platelets: 174 10*3/uL (ref 150–400)
RBC: 3.19 MIL/uL — ABNORMAL LOW (ref 3.87–5.11)
RDW: 12.7 % (ref 11.5–15.5)
WBC: 4.4 10*3/uL (ref 4.0–10.5)
nRBC: 0 % (ref 0.0–0.2)

## 2021-11-25 LAB — MAGNESIUM: Magnesium: 1.9 mg/dL (ref 1.7–2.4)

## 2021-11-25 LAB — COMPREHENSIVE METABOLIC PANEL
ALT: 43 U/L (ref 0–44)
AST: 35 U/L (ref 15–41)
Albumin: 3.1 g/dL — ABNORMAL LOW (ref 3.5–5.0)
Alkaline Phosphatase: 52 U/L (ref 38–126)
Anion gap: 12 (ref 5–15)
BUN: 9 mg/dL (ref 8–23)
CO2: 19 mmol/L — ABNORMAL LOW (ref 22–32)
Calcium: 8.5 mg/dL — ABNORMAL LOW (ref 8.9–10.3)
Chloride: 107 mmol/L (ref 98–111)
Creatinine, Ser: 0.64 mg/dL (ref 0.44–1.00)
GFR, Estimated: >60 mL/min (ref >60)
Glucose, Bld: 118 mg/dL — ABNORMAL HIGH (ref 70–99)
Potassium: 3.8 mmol/L (ref 3.5–5.1)
Sodium: 138 mmol/L (ref 135–145)
Total Bilirubin: 0.7 mg/dL (ref 0.3–1.2)
Total Protein: 5.6 g/dL — ABNORMAL LOW (ref 6.5–8.1)

## 2021-11-25 NOTE — Progress Notes (Signed)
PROGRESS NOTE    Rachael Rios  OJJ:[id number] DOB: Feb 11, 1951 DOA: 11/23/2021 PCP: Redmond School, MD   Brief Narrative:   70 year old with history of hypertension comes to the hospital after sustaining a fall at home.  Typically she lives at home alone and is independent.  Denies any loss of consciousness.  In the ED CT of the head was negative, left shoulder showed impacted fracture of the surgical neck of the humerus, right wrist shows comminuted fracture of the distal radius metaphysis with apex anterior angulation.  Orthopedic recommended no urgent evaluation and admitted for pain control, PT OT evaluation.  Assessment & Plan:   Principal Problem:   Fall Active Problems:   Humerus fracture   Wrist fracture   Essential hypertension   Mechanical fall Left shoulder and right wrist fracture - Consulted orthopedic by the ED, Dr. Burney Gauze who recommended sugar-tong splint for the right wrist and sling for the left shoulder - Pain control, bowel regimen.  PT/OT eval-pending - CK- WNL, vitamin D levels  Essential hypertension - On Cozaar  Hypothyroidism - Synthroid  Moderate protein calorie malnutrition - Dietitian consult    DVT prophylaxis: Subcu heparin Code Status: Full code Family Communication:  Deborah updated.   Status is: Inpatient  Remains inpatient appropriate because: PT/OT evaluation is still pending.  Assist with pain control.  Eventually SNF. Patient was functioning independently prior to this fall causing fracture of her shoulder and wrist.      Subjective: Sitting up in the chair, pain is little better. She is agreeable for rehab.   Examination: Constitutional: Not in acute distress Respiratory: Clear to auscultation bilaterally Cardiovascular: Normal sinus rhythm, no rubs Abdomen: Nontender nondistended good bowel sounds Musculoskeletal: b/l UE in slings.  Skin: No rashes seen Neurologic: CN 2-12 grossly intact.  And nonfocal Psychiatric:  Normal judgment and insight. Alert and oriented x 3. Normal mood.    Objective: Vitals:   11/24/21 1510 11/24/21 2112 11/25/21 0508 11/25/21 0825  BP:  (!) 155/72 131/71 114/78  Pulse:  (!) 105 100 (!) 102  Resp:  18 18 19   Temp:  98.8 F (37.1 C) 98.9 F (37.2 C)   TempSrc:      SpO2: 95% 97% 98%   Weight:      Height:        Intake/Output Summary (Last 24 hours) at 11/25/2021 0835 Last data filed at 11/25/2021 0831 Gross per 24 hour  Intake 2757.7 ml  Output 1 ml  Net 2756.7 ml   Filed Weights   11/23/21 1442  Weight: 50.3 kg     Data Reviewed:   CBC: Recent Labs  Lab 11/23/21 1519 11/24/21 0404 11/25/21 0416  WBC 5.8 6.2 4.4  NEUTROABS 4.2  --   --   HGB 12.8 9.9* 9.9*  HCT 36.0 29.8* 30.0*  MCV 90.5 93.1 94.0  PLT 241 162 937   Basic Metabolic Panel: Recent Labs  Lab 11/23/21 1519 11/24/21 0404 11/25/21 0416  NA 138 140 138  K 3.6 3.8 3.8  CL 109 113* 107  CO2 19* 16* 19*  GLUCOSE 189* 150* 118*  BUN 20 14 9   CREATININE 0.78 0.60 0.64  CALCIUM 9.0 8.1* 8.5*  MG  --  1.9 1.9   GFR: Estimated Creatinine Clearance: 50.5 mL/min (by C-G formula based on SCr of 0.64 mg/dL). Liver Function Tests: Recent Labs  Lab 11/24/21 0404 11/25/21 0416  AST 65* 35  ALT 58* 43  ALKPHOS 56 52  BILITOT 0.4 0.7  PROT 5.7* 5.6*  ALBUMIN 3.3* 3.1*   No results for input(s): LIPASE, AMYLASE in the last 168 hours. No results for input(s): AMMONIA in the last 168 hours. Coagulation Profile: No results for input(s): INR, PROTIME in the last 168 hours. Cardiac Enzymes: Recent Labs  Lab 11/24/21 0404  CKTOTAL 150   BNP (last 3 results) No results for input(s): PROBNP in the last 8760 hours. HbA1C: No results for input(s): HGBA1C in the last 72 hours. CBG: No results for input(s): GLUCAP in the last 168 hours. Lipid Profile: No results for input(s): CHOL, HDL, LDLCALC, TRIG, CHOLHDL, LDLDIRECT in the last 72 hours. Thyroid Function Tests: Recent Labs     11/24/21 0404  TSH 3.367   Anemia Panel: No results for input(s): VITAMINB12, FOLATE, FERRITIN, TIBC, IRON, RETICCTPCT in the last 72 hours. Sepsis Labs: No results for input(s): PROCALCITON, LATICACIDVEN in the last 168 hours.  Recent Results (from the past 240 hour(s))  Resp Panel by RT-PCR (Flu A&B, Covid) Nasopharyngeal Swab     Status: None   Collection Time: 11/23/21  5:28 PM   Specimen: Nasopharyngeal Swab; Nasopharyngeal(NP) swabs in vial transport medium  Result Value Ref Range Status   SARS Coronavirus 2 by RT PCR NEGATIVE NEGATIVE Final    Comment: (NOTE) SARS-CoV-2 target nucleic acids are NOT DETECTED.  The SARS-CoV-2 RNA is generally detectable in upper respiratory specimens during the acute phase of infection. The lowest concentration of SARS-CoV-2 viral copies this assay can detect is 138 copies/mL. A negative result does not preclude SARS-Cov-2 infection and should not be used as the sole basis for treatment or other patient management decisions. A negative result may occur with  improper specimen collection/handling, submission of specimen other than nasopharyngeal swab, presence of viral mutation(s) within the areas targeted by this assay, and inadequate number of viral copies(<138 copies/mL). A negative result must be combined with clinical observations, patient history, and epidemiological information. The expected result is Negative.  Fact Sheet for Patients:  EntrepreneurPulse.com.au  Fact Sheet for Healthcare Providers:  IncredibleEmployment.be  This test is no t yet approved or cleared by the [place] FDA and  has been authorized for detection and/or diagnosis of SARS-CoV-2 by FDA under an Emergency Use Authorization (EUA). This EUA will remain  in effect (meaning this test can be used) for the duration of the COVID-19 declaration under Section 564(b)(1) of the Act, 21 U.S.C.section 360bbb-3(b)(1), unless  the authorization is terminated  or revoked sooner.       Influenza A by PCR NEGATIVE NEGATIVE Final   Influenza B by PCR NEGATIVE NEGATIVE Final    Comment: (NOTE) The Xpert Xpress SARS-CoV-2/FLU/RSV plus assay is intended as an aid in the diagnosis of influenza from Nasopharyngeal swab specimens and should not be used as a sole basis for treatment. Nasal washings and aspirates are unacceptable for Xpert Xpress SARS-CoV-2/FLU/RSV testing.  Fact Sheet for Patients: EntrepreneurPulse.com.au  Fact Sheet for Healthcare Providers: IncredibleEmployment.be  This test is not yet approved or cleared by the [place] FDA and has been authorized for detection and/or diagnosis of SARS-CoV-2 by FDA under an Emergency Use Authorization (EUA). This EUA will remain in effect (meaning this test can be used) for the duration of the COVID-19 declaration under Section 564(b)(1) of the Act, 21 U.S.C. section 360bbb-3(b)(1), unless the authorization is terminated or revoked.  Performed at [facility], [address] [place], [place] [address]          Radiology Studies: DG Wrist Complete  Right  Result Date: 11/23/2021 CLINICAL DATA:  Fall with pain. EXAM: RIGHT WRIST - COMPLETE 3+ VIEW COMPARISON:  None. FINDINGS: Four views study shows a comminuted fracture of the distal radial metaphysis with apex anterior angulation. No definite fracture extension to the articular surface evident. Associated minimally displaced ulnar styloid fracture. Bones are diffusely demineralized. IMPRESSION: Comminuted fracture of the distal radial metaphysis with apex anterior angulation. Minimally displaced ulnar styloid fracture. Electronically Signed   By: Misty Stanley M.D.   On: 11/23/2021 15:44   CT Head Wo Contrast  Result Date: 11/23/2021 CLINICAL DATA:  Head trauma EXAM: CT HEAD WITHOUT CONTRAST TECHNIQUE: Contiguous axial images were obtained from the base of the  skull through the vertex without intravenous contrast. COMPARISON:  None. FINDINGS: Brain: No evidence of acute infarction, hemorrhage, hydrocephalus, extra-axial collection or mass lesion/mass effect. Mild periventricular white matter hypodensity. Vascular: No hyperdense vessel or unexpected calcification. Skull: Normal. Negative for fracture or focal lesion. Sinuses/Orbits: No acute finding. Other: Soft tissue contusion overlying the left orbit (series 2, image 8). IMPRESSION: 1. No acute intracranial pathology. Small-vessel white matter disease. 2. Soft tissue contusion overlying the left orbit. Electronically Signed   By: Delanna Ahmadi M.D.   On: 11/23/2021 16:21   DG Shoulder Left  Result Date: 11/23/2021 CLINICAL DATA:  Fall, pain EXAM: LEFT SHOULDER - 2+ VIEW COMPARISON:  None. FINDINGS: Impacted fracture of the surgical neck of the left humerus. Glenohumeral joint is in anatomic apposition. The acromioclavicular joint is preserved. IMPRESSION: Impacted fracture of the surgical neck of the left humerus. Glenohumeral joint is in anatomic apposition. Electronically Signed   By: Delanna Ahmadi M.D.   On: 11/23/2021 15:45        Scheduled Meds:  calcium carbonate  1,250 mg Oral BID WC   fentaNYL (SUBLIMAZE) injection  50 mcg Intravenous Once   heparin  5,000 Units Subcutaneous Q8H   levothyroxine  50 mcg Oral Daily   losartan  25 mg Oral Daily   omega-3 acid ethyl esters  1 g Oral Daily   senna-docusate  1 tablet Oral QHS   Continuous Infusions:  sodium chloride 75 mL/hr at 11/24/21 2124     LOS: 2 days   Time spent= 35 mins    Mylan Lengyel Arsenio Loader, MD [facility]  If 7PM-7AM, please contact night-coverage  11/25/2021, 8:35 AM

## 2021-11-25 NOTE — Plan of Care (Signed)
°  Problem: Acute Rehab PT Goals(only PT should resolve) Goal: Patient Will Transfer Sit To/From Stand Outcome: Progressing Flowsheets (Taken 11/25/2021 1423) Patient will transfer sit to/from stand: with supervision Goal: Pt Will Transfer Bed To Chair/Chair To Bed Outcome: Progressing Flowsheets (Taken 11/25/2021 1423) Pt will Transfer Bed to Chair/Chair to Bed: with supervision Goal: Pt Will Ambulate Outcome: Progressing Flowsheets (Taken 11/25/2021 1423) Pt will Ambulate:  > 125 feet  with supervision Goal: Pt Will Go Up/Down Stairs Outcome: Progressing Flowsheets (Taken 11/25/2021 1423) Pt will Go Up / Down Stairs:  3-5 stairs  with supervision  2:24 PM, 11/25/21 M. Sherlyn Lees, PT, DPT Physical Therapist- [place] Office Number: [phone]

## 2021-11-25 NOTE — Progress Notes (Signed)
Initial Nutrition Assessment  DOCUMENTATION CODES:   Underweight  INTERVENTION:  Recommend liberalize diet to regular  Cozy shack pudding with lunch   Snacks between meals as desired   NUTRITION DIAGNOSIS:   Increased nutrient needs related to  (acute injury - shoulder and wrist fracture) as evidenced by estimated needs.   GOAL:  Patient will meet greater than or equal to 90% of their needs   MONITOR:  PO intake, Labs, Skin, Weight trends  REASON FOR ASSESSMENT:   Consult Assessment of nutrition requirement/status  ASSESSMENT: Patient is a 70 yo female from home with hx of hypertension and hypothyroidism. Patient had a mechanical fall which fractured her left shoulder and right wrist.   Patient ate 75% of breakfast this morning. Able to feed herself. She lives alone and is accustomed to driving, grocery shopping and cooking her own meals. Her usual eating pattern is 3 times daily. Often eats a scrambled egg with bacon and slice of buttered toast at breakfast. Lunch is frequently a sandwich- banana or peanut butter.  In the evening favorites are pintos and cornbread. She takes Calcium plus D, MVI and omega 3 supplement and has for > 5 years. She doesn't drink ONS (too sweet). Encouraged protein foods with meals and talked about options.   Expect patient low albumin related to acute injury. Albumin has a half-life of 21 days and is strongly affected by stress response and inflammatory process, therefore, do not expect to see an improvement in this lab value during acute hospitalization.   Medications reviewed and include: Os-Cal, fentanyl, synthroid, Lovaza and Senokot-S.  IVF-NS @ 75 ml/hr  Labs:  BMP Latest Ref Rng & Units 11/25/2021 11/24/2021 11/23/2021  Glucose 70 - 99 mg/dL 118(H) 150(H) 189(H)  BUN 8 - 23 mg/dL 9 14 20   Creatinine 0.44 - 1.00 mg/dL 0.64 0.60 0.78  Sodium 135 - 145 mmol/L 138 140 138  Potassium 3.5 - 5.1 mmol/L 3.8 3.8 3.6  Chloride 98 - 111 mmol/L  107 113(H) 109  CO2 22 - 32 mmol/L 19(L) 16(L) 19(L)  Calcium 8.9 - 10.3 mg/dL 8.5(L) 8.1(L) 9.0     NUTRITION - FOCUSED PHYSICAL EXAM: Expect age related muscle loss based on pt diet recall and age.   Flowsheet Row Most Recent Value  Orbital Region No depletion  Upper Arm Region No depletion  Thoracic and Lumbar Region Mild depletion  Buccal Region No depletion  Temple Region No depletion  Clavicle Bone Region Mild depletion  Clavicle and Acromion Bone Region No depletion  [right shoulder/ unable to assess left due to injury]  Scapular Bone Region Unable to assess  Dorsal Hand Mild depletion  Edema (RD Assessment) Mild  Hair Reviewed  Eyes Reviewed  Mouth Reviewed  Skin Reviewed  [buised left eye with 2 stiches]  Nails Reviewed      Diet Order:   Diet Order             Diet Heart Room service appropriate? Yes; Fluid consistency: Thin  Diet effective now                   EDUCATION NEEDS:  Education needs have been addressed  Skin:  Skin Assessment: Reviewed RN Assessment (left eye bruising and required to stiches related to her fall)  Last BM:  12/24  Height:   Ht Readings from Last 1 Encounters:  11/23/21 5\' 5"  (1.651 m)    Weight:   Wt Readings from Last 1 Encounters:  11/23/21 50.3 kg  Ideal Body Weight:   57 kg  BMI:  Body mass index is 18.47 kg/m.  Estimated Nutritional Needs:   Kcal:  1500-1700  Protein:  70-75 gr  Fluid:  >1200 ml daily  Colman Cater MS,RD,CSG,LDN Contact: Shea Evans

## 2021-11-25 NOTE — Evaluation (Signed)
Occupational Therapy Evaluation Patient Details Name: Rachael Rios MRN: [id number] DOB: 05/19/1951 Today's Date: 11/25/2021   History of Present Illness Rachael Rios  is a 70 y.o. female, with history of hypertension presents ED with a chief complaint of fall.  Patient lives at home alone, and is independent with all ADLs.  Patient reports that she was walking outside when a tarp on her porch was caught in the wind and wrapped around her foot.  This tripped patient and she fell off of the porch. Pt found to have left shoulder showed impacted fracture of the surgical neck of the humerus, right wrist shows comminuted fracture of the distal radius metaphysis with apex anterior angulation   Clinical Impression   Pt agreeable to OT evaluation, very motivated to regain independence. Pt received with 2 slings, RUE in forearm sugar tong splint. Assisted pt with toileting and transfer to chair, removed sling on RUE to allow pt to elevate on pillows while seated. Educated on need to wear sling on LUE at all times. Pt requiring max to total assist for ADLs due to BUE fractures, min guard to min assist for transfers and mobility. Pt does not have caregiver at home, recommend SNF on discharge to improve safety and independence in ADLs and allow for healing.       Recommendations for follow up therapy are one component of a multi-disciplinary discharge planning process, led by the attending physician.  Recommendations may be updated based on patient status, additional functional criteria and insurance authorization.   Follow Up Recommendations  Skilled nursing-short term rehab (<3 hours/day)    Assistance Recommended at Discharge Frequent or constant Supervision/Assistance  Functional Status Assessment  Patient has had a recent decline in their functional status and demonstrates the ability to make significant improvements in function in a reasonable and predictable amount of time.  Equipment  Recommendations  None recommended by OT       Precautions / Restrictions Precautions Precautions: Fall Restrictions Weight Bearing Restrictions: Yes RUE Weight Bearing: Non weight bearing LUE Weight Bearing: Non weight bearing      Mobility Bed Mobility Overal bed mobility: Modified Independent             General bed mobility comments: increased time    Transfers Overall transfer level: Needs assistance Equipment used: None Transfers: Sit to/from Stand;Bed to chair/wheelchair/BSC Sit to Stand: Min assist Stand pivot transfers: Min guard                    ADL either performed or assessed with clinical judgement   ADL Overall ADL's : Needs assistance/impaired Eating/Feeding: Set up;Sitting   Grooming: Maximal assistance;Sitting   Upper Body Bathing: Total assistance;Sitting   Lower Body Bathing: Total assistance;Sitting/lateral leans   Upper Body Dressing : Maximal assistance;Sitting   Lower Body Dressing: Total assistance;Sitting/lateral leans   Toilet Transfer: Minimal assistance;Stand-pivot;BSC/3in1 Toilet Transfer Details (indicate cue type and reason): Pt able to transfer to [facility], increased time for safety Toileting- Clothing Manipulation and Hygiene: Maximal assistance;Sit to/from stand Toileting - Clothing Manipulation Details (indicate cue type and reason): Assist with peri-care as pt is unable to use UEs     Functional mobility during ADLs: Min guard General ADL Comments: Pt requiring max to total assist for ADLs due to BUE fractures     Vision Baseline Vision/History: 1 Wears glasses Ability to See in Adequate Light: 1 Impaired Patient Visual Report: No change from baseline Vision Assessment?: No apparent visual deficits Additional Comments: pt  wears reading glasses only            Pertinent Vitals/Pain Pain Assessment: No/denies pain     Hand Dominance Right   Extremity/Trunk Assessment Upper Extremity Assessment Upper  Extremity Assessment: RUE deficits/detail;LUE deficits/detail RUE Deficits / Details: comminuted wrist fx, pt in long arm sugar tong splint from elbow down RUE: Unable to fully assess due to immobilization RUE Sensation: WNL RUE Coordination: decreased fine motor LUE Deficits / Details: head of humerus fx LUE: Unable to fully assess due to immobilization;Unable to fully assess due to pain LUE Sensation: WNL LUE Coordination: decreased gross motor   Lower Extremity Assessment Lower Extremity Assessment: Defer to PT evaluation   Cervical / Trunk Assessment Cervical / Trunk Assessment: Normal   Communication Communication Communication: No difficulties   Cognition Arousal/Alertness: Awake/alert Behavior During Therapy: WFL for tasks assessed/performed Overall Cognitive Status: Within Functional Limits for tasks assessed                                                  Home Living Family/patient expects to be discharged to:: Skilled nursing facility Living Arrangements: Alone Available Help at Discharge: Family;Available PRN/intermittently Type of Home: House Home Access: Stairs to enter [employer] of Steps: 3 Entrance Stairs-Rails: Right;Left;Can reach both Home Layout: Two level;Able to live on main level with bedroom/bathroom Alternate Level Stairs-Number of Steps: unsure Alternate Level Stairs-Rails: Right Bathroom Shower/Tub: Tub/shower unit;Walk-in shower   Bathroom Toilet: Standard     Home Equipment: [employer], [place] (2 wheels)          Prior Functioning/Environment Prior Level of Function : Independent/Modified Independent;Driving             Mobility Comments: independent ADLs Comments: independent        OT Problem List: Decreased strength;Decreased activity tolerance;Decreased range of motion;Decreased safety awareness;Decreased knowledge of use of DME or AE;Impaired UE functional use;Pain      OT  Treatment/Interventions: Self-care/ADL training;Therapeutic exercise;DME and/or AE instruction;Therapeutic activities;Patient/family education    OT Goals(Current goals can be found in the care plan section) Acute Rehab OT Goals Patient Stated Goal: To get better and go home OT Goal Formulation: With patient Time For Goal Achievement: 12/09/21 Potential to Achieve Goals: Good  OT Frequency: Min 1X/week   Barriers to D/C: Decreased caregiver support  pt does not have 24/7 care available             End of Session Equipment Utilized During Treatment: Other (comment) (sling) Nurse Communication: Mobility status;Other (comment) (RUE sling off for elevation while up in chair)  Activity Tolerance: Patient tolerated treatment well Patient left: in chair;with call bell/phone within reach  OT Visit Diagnosis: History of falling (Z91.81);Pain Pain - Right/Left: Left Pain - part of body: Shoulder                Time: 1029-1106 OT Time Calculation (min): 37 min Charges:  OT General Charges $OT Visit: 1 Visit OT Evaluation $OT Eval Low Complexity: 1 Low OT Treatments $Self Care/Home Management : 8-22 mins   Guadelupe Sabin, OTR/L  [phone] 11/25/2021, 12:07 PM

## 2021-11-25 NOTE — Evaluation (Signed)
Physical Therapy Evaluation Patient Details Name: Rachael Rios MRN: [id number] DOB: 1951-04-01 Today's Date: 11/25/2021  History of Present Illness  Rachael Rios  is a 70 y.o. female, with history of hypertension presents ED with a chief complaint of fall.  Patient lives at home alone, and is independent with all ADLs.  Patient reports that she was walking outside when a tarp on her porch was caught in the wind and wrapped around her foot.  This tripped patient and she fell off of the porch. Pt found to have left shoulder showed impacted fracture of the surgical neck of the humerus, right wrist shows comminuted fracture of the distal radius metaphysis with apex anterior angulation   Clinical Impression   Patient presents with BUE impairments from recent fractures and as such her functional mobility has been impacted as she requires min A for functional transfers due to inability to push self up from chair/bed and requiring steadying support when walking as no suitable AD is present to accommodate her BUE NWB status.  Some gait/balance deficits present requiring steadying support throughout walking but demo good safety awareness.  Continued services indicated while hospitalized and may benefit from continued rehab services in SNF venue to improve functional mobility and train/instruct in adaptive/compensatory strategies to enhance functional mobility for safe transition to home.        Recommendations for follow up therapy are one component of a multi-disciplinary discharge planning process, led by the attending physician.  Recommendations may be updated based on patient status, additional functional criteria and insurance authorization.  Follow Up Recommendations Skilled nursing-short term rehab (<3 hours/day)    Assistance Recommended at Discharge Intermittent Supervision/Assistance  Functional Status Assessment Patient has had a recent decline in their functional status and demonstrates the  ability to make significant improvements in function in a reasonable and predictable amount of time.  Equipment Recommendations  None recommended by PT    Recommendations for Other Services       Precautions / Restrictions Precautions Precautions: Fall Restrictions Weight Bearing Restrictions: Yes RUE Weight Bearing: Non weight bearing LUE Weight Bearing: Non weight bearing      Mobility  Bed Mobility Overal bed mobility: Modified Independent             General bed mobility comments: increased time Patient Response: Cooperative  Transfers Overall transfer level: Needs assistance Equipment used: None Transfers: Sit to/from Stand;Bed to chair/wheelchair/BSC Sit to Stand: Min assist Stand pivot transfers: Min guard              Ambulation/Gait Ambulation/Gait assistance: Min guard Gait Distance (Feet): 100 Feet Assistive device: None;1 person hand held assist Gait Pattern/deviations: Decreased stride length          Stairs Stairs: Yes Stairs assistance: Min assist Stair Management: No rails Number of Stairs: 1    Wheelchair Mobility    Modified Rankin (Stroke Patients Only)       Balance Overall balance assessment: Mild deficits observed, not formally tested                                           Pertinent Vitals/Pain      Home Living Family/patient expects to be discharged to:: Skilled nursing facility Living Arrangements: Alone Available Help at Discharge: Family;Available PRN/intermittently Type of Home: House Home Access: Stairs to enter Entrance Stairs-Rails: Right;Left;Can reach both Entrance Stairs-Number of Steps: 3  Alternate Level Stairs-Number of Steps: unsure Home Layout: Two level;Able to live on main level with bedroom/bathroom Home Equipment: [place] (2 wheels)      Prior Function Prior Level of Function : Independent/Modified Independent;Driving             Mobility Comments:  independent ADLs Comments: independent     Hand Dominance   Dominant Hand: Right    Extremity/Trunk Assessment   Upper Extremity Assessment Upper Extremity Assessment: RUE deficits/detail;LUE deficits/detail RUE Deficits / Details: comminuted wrist fx, pt in long arm sugar tong splint from elbow down RUE Sensation: WNL RUE Coordination: decreased fine motor LUE Deficits / Details: head of humerus fx LUE Sensation: WNL LUE Coordination: decreased gross motor    Lower Extremity Assessment Lower Extremity Assessment: Generalized weakness    Cervical / Trunk Assessment Cervical / Trunk Assessment: Normal  Communication   Communication: No difficulties  Cognition                                                General Comments      Exercises     Assessment/Plan    PT Assessment Patient needs continued PT services  PT Problem List Decreased strength;Decreased activity tolerance;Decreased balance;Decreased mobility;Decreased knowledge of use of DME       PT Treatment Interventions DME instruction;Gait training;Stair training;Functional mobility training;Therapeutic activities;Patient/family education;Neuromuscular re-education;Balance training;Therapeutic exercise;Manual techniques    PT Goals (Current goals can be found in the Care Plan section)  Acute Rehab PT Goals Patient Stated Goal: "return home when able" PT Goal Formulation: With patient Time For Goal Achievement: 12/02/21 Potential to Achieve Goals: Good    Frequency Min 3X/week   Barriers to discharge Decreased caregiver support pt reports she lives alone with family able to check on her intermittently    Co-evaluation               AM-PAC PT "6 Clicks" Mobility  Outcome Measure Help needed turning from your back to your side while in a flat bed without using bedrails?: None Help needed moving from lying on your back to sitting on the side of a flat bed without using  bedrails?: None Help needed moving to and from a bed to a chair (including a wheelchair)?: A Little Help needed standing up from a chair using your arms (e.g., wheelchair or bedside chair)?: A Little Help needed to walk in hospital room?: A Little Help needed climbing 3-5 steps with a railing? : A Lot 6 Click Score: 19    End of Session Equipment Utilized During Treatment: Gait belt Activity Tolerance: Patient tolerated treatment well Patient left: in chair;with call bell/phone within reach Nurse Communication: Mobility status PT Visit Diagnosis: Unsteadiness on feet (R26.81);Muscle weakness (generalized) (M62.81);Difficulty in walking, not elsewhere classified (R26.2)    Time: 1320-1350 PT Time Calculation (min) (ACUTE ONLY): 30 min   Charges:   PT Evaluation $PT Eval Low Complexity: 1 Low PT Treatments $Gait Training: 8-22 mins       2:22 PM, 11/25/21 M. Sherlyn Lees, PT, DPT Physical Therapist- [place] Office Number: [phone]

## 2021-11-25 NOTE — Plan of Care (Signed)
°  Problem: Acute Rehab OT Goals (only OT should resolve) Goal: Pt. Will Perform Grooming Flowsheets (Taken 11/25/2021 1210) Pt Will Perform Grooming:  with set-up  sitting Goal: Pt. Will Perform Upper Body Dressing Flowsheets (Taken 11/25/2021 1210) Pt Will Perform Upper Body Dressing:  with mod assist  sitting Goal: Pt. Will Transfer To Toilet Flowsheets (Taken 11/25/2021 1210) Pt Will Transfer to Toilet:  with supervision  ambulating  regular height toilet Goal: Pt. Will Perform Toileting-Clothing Manipulation Flowsheets (Taken 11/25/2021 1210) Pt Will Perform Toileting - Clothing Manipulation and hygiene:  with mod assist  sitting/lateral leans  sit to/from stand

## 2021-11-26 DIAGNOSIS — S62101A Fracture of unspecified carpal bone, right wrist, initial encounter for closed fracture: Secondary | ICD-10-CM | POA: Diagnosis not present

## 2021-11-26 DIAGNOSIS — E440 Moderate protein-calorie malnutrition: Secondary | ICD-10-CM | POA: Diagnosis not present

## 2021-11-26 DIAGNOSIS — S62101D Fracture of unspecified carpal bone, right wrist, subsequent encounter for fracture with routine healing: Secondary | ICD-10-CM | POA: Diagnosis not present

## 2021-11-26 DIAGNOSIS — R41841 Cognitive communication deficit: Secondary | ICD-10-CM | POA: Diagnosis not present

## 2021-11-26 DIAGNOSIS — Z9181 History of falling: Secondary | ICD-10-CM | POA: Diagnosis not present

## 2021-11-26 DIAGNOSIS — R262 Difficulty in walking, not elsewhere classified: Secondary | ICD-10-CM | POA: Diagnosis not present

## 2021-11-26 DIAGNOSIS — I10 Essential (primary) hypertension: Secondary | ICD-10-CM | POA: Diagnosis not present

## 2021-11-26 DIAGNOSIS — S42202A Unspecified fracture of upper end of left humerus, initial encounter for closed fracture: Secondary | ICD-10-CM | POA: Diagnosis not present

## 2021-11-26 DIAGNOSIS — R2689 Other abnormalities of gait and mobility: Secondary | ICD-10-CM | POA: Diagnosis not present

## 2021-11-26 DIAGNOSIS — S42222D 2-part displaced fracture of surgical neck of left humerus, subsequent encounter for fracture with routine healing: Secondary | ICD-10-CM | POA: Diagnosis not present

## 2021-11-26 DIAGNOSIS — M6281 Muscle weakness (generalized): Secondary | ICD-10-CM | POA: Diagnosis not present

## 2021-11-26 DIAGNOSIS — E039 Hypothyroidism, unspecified: Secondary | ICD-10-CM | POA: Diagnosis not present

## 2021-11-26 DIAGNOSIS — S52611D Displaced fracture of right ulna styloid process, subsequent encounter for closed fracture with routine healing: Secondary | ICD-10-CM | POA: Diagnosis not present

## 2021-11-26 DIAGNOSIS — W19XXXA Unspecified fall, initial encounter: Secondary | ICD-10-CM | POA: Diagnosis not present

## 2021-11-26 DIAGNOSIS — S42215A Unspecified nondisplaced fracture of surgical neck of left humerus, initial encounter for closed fracture: Secondary | ICD-10-CM | POA: Diagnosis not present

## 2021-11-26 DIAGNOSIS — S42202D Unspecified fracture of upper end of left humerus, subsequent encounter for fracture with routine healing: Secondary | ICD-10-CM | POA: Diagnosis not present

## 2021-11-26 DIAGNOSIS — Z7401 Bed confinement status: Secondary | ICD-10-CM | POA: Diagnosis not present

## 2021-11-26 DIAGNOSIS — R5381 Other malaise: Secondary | ICD-10-CM | POA: Diagnosis not present

## 2021-11-26 DIAGNOSIS — Z743 Need for continuous supervision: Secondary | ICD-10-CM | POA: Diagnosis not present

## 2021-11-26 DIAGNOSIS — R279 Unspecified lack of coordination: Secondary | ICD-10-CM | POA: Diagnosis not present

## 2021-11-26 DIAGNOSIS — S42213A Unspecified displaced fracture of surgical neck of unspecified humerus, initial encounter for closed fracture: Secondary | ICD-10-CM | POA: Diagnosis not present

## 2021-11-26 DIAGNOSIS — D649 Anemia, unspecified: Secondary | ICD-10-CM | POA: Diagnosis not present

## 2021-11-26 DIAGNOSIS — S52501D Unspecified fracture of the lower end of right radius, subsequent encounter for closed fracture with routine healing: Secondary | ICD-10-CM | POA: Diagnosis not present

## 2021-11-26 LAB — CBC
HCT: 28.3 % — ABNORMAL LOW (ref 36.0–46.0)
Hemoglobin: 9.4 g/dL — ABNORMAL LOW (ref 12.0–15.0)
MCH: 30.1 pg (ref 26.0–34.0)
MCHC: 33.2 g/dL (ref 30.0–36.0)
MCV: 90.7 fL (ref 80.0–100.0)
Platelets: 166 10*3/uL (ref 150–400)
RBC: 3.12 MIL/uL — ABNORMAL LOW (ref 3.87–5.11)
RDW: 12.4 % (ref 11.5–15.5)
WBC: 3.9 10*3/uL — ABNORMAL LOW (ref 4.0–10.5)
nRBC: 0 % (ref 0.0–0.2)

## 2021-11-26 LAB — COMPREHENSIVE METABOLIC PANEL
ALT: 43 U/L (ref 0–44)
AST: 39 U/L (ref 15–41)
Albumin: 3.1 g/dL — ABNORMAL LOW (ref 3.5–5.0)
Alkaline Phosphatase: 53 U/L (ref 38–126)
Anion gap: 6 (ref 5–15)
BUN: 9 mg/dL (ref 8–23)
CO2: 21 mmol/L — ABNORMAL LOW (ref 22–32)
Calcium: 8.3 mg/dL — ABNORMAL LOW (ref 8.9–10.3)
Chloride: 113 mmol/L — ABNORMAL HIGH (ref 98–111)
Creatinine, Ser: 0.6 mg/dL (ref 0.44–1.00)
GFR, Estimated: >60 mL/min (ref >60)
Glucose, Bld: 112 mg/dL — ABNORMAL HIGH (ref 70–99)
Potassium: 3.5 mmol/L (ref 3.5–5.1)
Sodium: 140 mmol/L (ref 135–145)
Total Bilirubin: 0.7 mg/dL (ref 0.3–1.2)
Total Protein: 5.8 g/dL — ABNORMAL LOW (ref 6.5–8.1)

## 2021-11-26 LAB — MAGNESIUM: Magnesium: 1.9 mg/dL (ref 1.7–2.4)

## 2021-11-26 MED ORDER — SENNOSIDES-DOCUSATE SODIUM 8.6-50 MG PO TABS: 1.0000 | ORAL_TABLET | Freq: Every day | ORAL | Status: AC

## 2021-11-26 MED ORDER — OXYCODONE HCL 5 MG PO TABS: 5.0000 mg | ORAL_TABLET | ORAL | 0 refills | Status: AC | PRN

## 2021-11-26 MED ORDER — POLYETHYLENE GLYCOL 3350 17 G PO PACK: 17.0000 g | PACK | Freq: Every day | ORAL | 0 refills | Status: AC | PRN

## 2021-11-26 MED ORDER — POTASSIUM CHLORIDE CRYS ER 20 MEQ PO TBCR
40.0000 meq | EXTENDED_RELEASE_TABLET | Freq: Once | ORAL | Status: AC
  Administered 2021-11-26: 09:00:00 40 meq via ORAL
  Filled 2021-11-26: qty 2

## 2021-11-26 NOTE — Progress Notes (Signed)
Report called to Scientist, water quality at [facility].Patient IV removed and is aware that she is discharging today for rehab.

## 2021-11-26 NOTE — Discharge Summary (Signed)
Physician Discharge Summary  Rachael Rios ERD:[id number] DOB: [birth year] DOA: 11/23/2021  PCP: Redmond School, MD  Admit date: 11/23/2021 Discharge date: 11/26/2021  Admitted From: Home Disposition: SNF  Recommendations for Outpatient Follow-up:  Follow up with PCP in 1-2 weeks Please obtain BMP/CBC in one week your next doctors visit.  Pain medicine with bowel regimen prescribed  Discharge Condition: Stable CODE STATUS: Full code Diet recommendation: Low-salt  Brief/Interim Summary: 70 year old with history of hypertension comes to the hospital after sustaining a fall at home.  Typically she lives at home alone and is independent.  Denies any loss of consciousness.  In the ED CT of the head was negative, left shoulder showed impacted fracture of the surgical neck of the humerus, right wrist shows comminuted fracture of the distal radius metaphysis with apex anterior angulation.  Orthopedic recommended no urgent evaluation and admitted for pain control, PT OT evaluation.  He was recommended rehab for her therefore arrangements are to be made by TOC. Medically stable this morning to be transition when bed is available.   Assessment & Plan:   Principal Problem:   Fall Active Problems:   Humerus fracture   Wrist fracture   Essential hypertension     Mechanical fall Left shoulder and right wrist fracture - Consulted orthopedic by the ED, Dr. Burney Gauze who recommended sugar-tong splint for the right wrist and sling for the left shoulder - Pain control, bowel regimen.  PT/OT eval-SNF. - CK- WNL, vitamin D levels  Essential hypertension - On Cozaar  Hypothyroidism - Synthroid   Moderate protein calorie malnutrition - Dietitian consult      Body mass index is 18.47 kg/m.         Discharge Diagnoses:  Principal Problem:   Fall Active Problems:   Humerus fracture   Wrist fracture   Essential hypertension      Consultations: Case reviewed by orthopedic in  the ED.  Dr. Burney Gauze  Subjective: Feels a little better, able to move her arm a little bit more.  Discharge Exam: Vitals:   11/25/21 1403 11/25/21 2127  BP: (!) 127/97 (!) 133/59  Pulse: (!) 104 94  Resp: 18 18  Temp: 98.6 F (37 C) 98 F (36.7 C)  SpO2: 98% 97%   Vitals:   11/25/21 0508 11/25/21 0825 11/25/21 1403 11/25/21 2127  BP: 131/71 114/78 (!) 127/97 (!) 133/59  Pulse: 100 (!) 102 (!) 104 94  Resp: 18 19 18 18   Temp: 98.9 F (37.2 C)  98.6 F (37 C) 98 F (36.7 C)  TempSrc:   Oral   SpO2: 98%  98% 97%  Weight:      Height:        General: Pt is alert, awake, not in acute distress Cardiovascular: RRR, S1/S2 +, no rubs, no gallops Respiratory: CTA bilaterally, no wheezing, no rhonchi Abdominal: Soft, NT, ND, bowel sounds + Extremities: no edema, no cyanosis.  Bilateral upper extremity is in slings  Discharge Instructions   Allergies as of 11/26/2021   No Known Allergies      Medication List     STOP taking these medications    polyethylene glycol-electrolytes 420 g solution Commonly known as: TriLyte       TAKE these medications    alendronate 70 MG tablet Commonly known as: FOSAMAX Take 70 mg by mouth once a week. Take with a full glass of water on an empty stomach. Takes on Sundays.   calcium carbonate 1500 (600 Ca) MG Tabs tablet Commonly  known as: OSCAL Take 1,500 mg by mouth 2 (two) times daily with a meal.   CENTRUM SILVER PO Take 1 tablet by mouth daily.   fish oil-omega-3 fatty acids 1000 MG capsule Take 1 g by mouth daily.   levothyroxine 50 MCG tablet Commonly known as: SYNTHROID Take 50 mcg by mouth daily.   losartan 25 MG tablet Commonly known as: COZAAR Take 25 mg by mouth daily.   oxyCODONE 5 MG immediate release tablet Commonly known as: Oxy IR/ROXICODONE Take 1 tablet (5 mg total) by mouth every 4 (four) hours as needed for moderate pain.   polyethylene glycol 17 g packet Commonly known as: MIRALAX /  GLYCOLAX Take 17 g by mouth daily as needed for mild constipation.   senna-docusate 8.6-50 MG tablet Commonly known as: Senokot-S Take 1 tablet by mouth at bedtime.        Follow-up Information     Redmond School, MD Follow up in 1 week(s).   Specialty: Internal Medicine Contact information: [address] [place] [address] [phone]                No Known Allergies  You were cared for by a hospitalist during your hospital stay. If you have any questions about your discharge medications or the care you received while you were in the hospital after you are discharged, you can call the unit and asked to speak with the hospitalist on call if the hospitalist that took care of you is not available. Once you are discharged, your primary care physician will handle any further medical issues. Please note that no refills for any discharge medications will be authorized once you are discharged, as it is imperative that you return to your primary care physician (or establish a relationship with a primary care physician if you do not have one) for your aftercare needs so that they can reassess your need for medications and monitor your lab values.   Procedures/Studies: DG Wrist Complete Right  Result Date: 11/23/2021 CLINICAL DATA:  Fall with pain. EXAM: RIGHT WRIST - COMPLETE 3+ VIEW COMPARISON:  None. FINDINGS: Four views study shows a comminuted fracture of the distal radial metaphysis with apex anterior angulation. No definite fracture extension to the articular surface evident. Associated minimally displaced ulnar styloid fracture. Bones are diffusely demineralized. IMPRESSION: Comminuted fracture of the distal radial metaphysis with apex anterior angulation. Minimally displaced ulnar styloid fracture. Electronically Signed   By: Misty Stanley M.D.   On: 11/23/2021 15:44   CT Head Wo Contrast  Result Date: 11/23/2021 CLINICAL DATA:  Head trauma EXAM: CT HEAD WITHOUT  CONTRAST TECHNIQUE: Contiguous axial images were obtained from the base of the skull through the vertex without intravenous contrast. COMPARISON:  None. FINDINGS: Brain: No evidence of acute infarction, hemorrhage, hydrocephalus, extra-axial collection or mass lesion/mass effect. Mild periventricular white matter hypodensity. Vascular: No hyperdense vessel or unexpected calcification. Skull: Normal. Negative for fracture or focal lesion. Sinuses/Orbits: No acute finding. Other: Soft tissue contusion overlying the left orbit (series 2, image 8). IMPRESSION: 1. No acute intracranial pathology. Small-vessel white matter disease. 2. Soft tissue contusion overlying the left orbit. Electronically Signed   By: Delanna Ahmadi M.D.   On: 11/23/2021 16:21   DG Shoulder Left  Result Date: 11/23/2021 CLINICAL DATA:  Fall, pain EXAM: LEFT SHOULDER - 2+ VIEW COMPARISON:  None. FINDINGS: Impacted fracture of the surgical neck of the left humerus. Glenohumeral joint is in anatomic apposition. The acromioclavicular joint is preserved. IMPRESSION: Impacted  fracture of the surgical neck of the left humerus. Glenohumeral joint is in anatomic apposition. Electronically Signed   By: Delanna Ahmadi M.D.   On: 11/23/2021 15:45   MM 3D SCREEN BREAST BILATERAL  Result Date: 10/31/2021 CLINICAL DATA:  Screening. EXAM: DIGITAL SCREENING BILATERAL MAMMOGRAM WITH TOMOSYNTHESIS AND CAD TECHNIQUE: Bilateral screening digital craniocaudal and mediolateral oblique mammograms were obtained. Bilateral screening digital breast tomosynthesis was performed. The images were evaluated with computer-aided detection. COMPARISON:  Previous exam(s). ACR Breast Density Category b: There are scattered areas of fibroglandular density. FINDINGS: There are no findings suspicious for malignancy. IMPRESSION: No mammographic evidence of malignancy. A result letter of this screening mammogram will be mailed directly to the patient. RECOMMENDATION: Screening  mammogram in one year. (Code:[id number]) BI-RADS CATEGORY  1: Negative. Electronically Signed   By: Kristopher Oppenheim M.D.   On: 10/31/2021 16:29     The results of significant diagnostics from this hospitalization (including imaging, microbiology, ancillary and laboratory) are listed below for reference.     Microbiology: Recent Results (from the past 240 hour(s))  Resp Panel by RT-PCR (Flu A&B, Covid) Nasopharyngeal Swab     Status: None   Collection Time: 11/23/21  5:28 PM   Specimen: Nasopharyngeal Swab; Nasopharyngeal(NP) swabs in vial transport medium  Result Value Ref Range Status   SARS Coronavirus 2 by RT PCR NEGATIVE NEGATIVE Final    Comment: (NOTE) SARS-CoV-2 target nucleic acids are NOT DETECTED.  The SARS-CoV-2 RNA is generally detectable in upper respiratory specimens during the acute phase of infection. The lowest concentration of SARS-CoV-2 viral copies this assay can detect is 138 copies/mL. A negative result does not preclude SARS-Cov-2 infection and should not be used as the sole basis for treatment or other patient management decisions. A negative result may occur with  improper specimen collection/handling, submission of specimen other than nasopharyngeal swab, presence of viral mutation(s) within the areas targeted by this assay, and inadequate number of viral copies(<138 copies/mL). A negative result must be combined with clinical observations, patient history, and epidemiological information. The expected result is Negative.  Fact Sheet for Patients:  EntrepreneurPulse.com.au  Fact Sheet for Healthcare Providers:  IncredibleEmployment.be  This test is no t yet approved or cleared by the [place] FDA and  has been authorized for detection and/or diagnosis of SARS-CoV-2 by FDA under an Emergency Use Authorization (EUA). This EUA will remain  in effect (meaning this test can be used) for the duration of the COVID-19  declaration under Section 564(b)(1) of the Act, 21 U.S.C.section 360bbb-3(b)(1), unless the authorization is terminated  or revoked sooner.       Influenza A by PCR NEGATIVE NEGATIVE Final   Influenza B by PCR NEGATIVE NEGATIVE Final    Comment: (NOTE) The Xpert Xpress SARS-CoV-2/FLU/RSV plus assay is intended as an aid in the diagnosis of influenza from Nasopharyngeal swab specimens and should not be used as a sole basis for treatment. Nasal washings and aspirates are unacceptable for Xpert Xpress SARS-CoV-2/FLU/RSV testing.  Fact Sheet for Patients: EntrepreneurPulse.com.au  Fact Sheet for Healthcare Providers: IncredibleEmployment.be  This test is not yet approved or cleared by the [place] FDA and has been authorized for detection and/or diagnosis of SARS-CoV-2 by FDA under an Emergency Use Authorization (EUA). This EUA will remain in effect (meaning this test can be used) for the duration of the COVID-19 declaration under Section 564(b)(1) of the Act, 21 U.S.C. section 360bbb-3(b)(1), unless the authorization is terminated or revoked.  Performed at [facility]  [address] [place], [place] [address]      Labs: BNP (last 3 results) No results for input(s): BNP in the last 8760 hours. Basic Metabolic Panel: Recent Labs  Lab 11/23/21 1519 11/24/21 0404 11/25/21 0416 11/26/21 0449  NA 138 140 138 140  K 3.6 3.8 3.8 3.5  CL 109 113* 107 113*  CO2 19* 16* 19* 21*  GLUCOSE 189* 150* 118* 112*  BUN 20 14 9 9   CREATININE 0.78 0.60 0.64 0.60  CALCIUM 9.0 8.1* 8.5* 8.3*  MG  --  1.9 1.9 1.9   Liver Function Tests: Recent Labs  Lab 11/24/21 0404 11/25/21 0416 11/26/21 0449  AST 65* 35 39  ALT 58* 43 43  ALKPHOS 56 52 53  BILITOT 0.4 0.7 0.7  PROT 5.7* 5.6* 5.8*  ALBUMIN 3.3* 3.1* 3.1*   No results for input(s): LIPASE, AMYLASE in the last 168 hours. No results for input(s): AMMONIA in the last 168  hours. CBC: Recent Labs  Lab 11/23/21 1519 11/24/21 0404 11/25/21 0416 11/26/21 0449  WBC 5.8 6.2 4.4 3.9*  NEUTROABS 4.2  --   --   --   HGB 12.8 9.9* 9.9* 9.4*  HCT 36.0 29.8* 30.0* 28.3*  MCV 90.5 93.1 94.0 90.7  PLT 241 162 174 166   Cardiac Enzymes: Recent Labs  Lab 11/24/21 0404  CKTOTAL 150   BNP: Invalid input(s): POCBNP CBG: No results for input(s): GLUCAP in the last 168 hours. D-Dimer No results for input(s): DDIMER in the last 72 hours. Hgb A1c No results for input(s): HGBA1C in the last 72 hours. Lipid Profile No results for input(s): CHOL, HDL, LDLCALC, TRIG, CHOLHDL, LDLDIRECT in the last 72 hours. Thyroid function studies Recent Labs    11/24/21 0404  TSH 3.367   Anemia work up No results for input(s): VITAMINB12, FOLATE, FERRITIN, TIBC, IRON, RETICCTPCT in the last 72 hours. Urinalysis No results found for: COLORURINE, APPEARANCEUR, [place], [place], GLUCOSEU, [place], [place], [place], PROTEINUR, UROBILINOGEN, NITRITE, LEUKOCYTESUR Sepsis Labs Invalid input(s): PROCALCITONIN,  WBC,  LACTICIDVEN Microbiology Recent Results (from the past 240 hour(s))  Resp Panel by RT-PCR (Flu A&B, Covid) Nasopharyngeal Swab     Status: None   Collection Time: 11/23/21  5:28 PM   Specimen: Nasopharyngeal Swab; Nasopharyngeal(NP) swabs in vial transport medium  Result Value Ref Range Status   SARS Coronavirus 2 by RT PCR NEGATIVE NEGATIVE Final    Comment: (NOTE) SARS-CoV-2 target nucleic acids are NOT DETECTED.  The SARS-CoV-2 RNA is generally detectable in upper respiratory specimens during the acute phase of infection. The lowest concentration of SARS-CoV-2 viral copies this assay can detect is 138 copies/mL. A negative result does not preclude SARS-Cov-2 infection and should not be used as the sole basis for treatment or other patient management decisions. A negative result may occur with  improper specimen collection/handling, submission of specimen  other than nasopharyngeal swab, presence of viral mutation(s) within the areas targeted by this assay, and inadequate number of viral copies(<138 copies/mL). A negative result must be combined with clinical observations, patient history, and epidemiological information. The expected result is Negative.  Fact Sheet for Patients:  EntrepreneurPulse.com.au  Fact Sheet for Healthcare Providers:  IncredibleEmployment.be  This test is no t yet approved or cleared by the [place] FDA and  has been authorized for detection and/or diagnosis of SARS-CoV-2 by FDA under an Emergency Use Authorization (EUA). This EUA will remain  in effect (meaning this test can be used) for the duration of the  COVID-19 declaration under Section 564(b)(1) of the Act, 21 U.S.C.section 360bbb-3(b)(1), unless the authorization is terminated  or revoked sooner.       Influenza A by PCR NEGATIVE NEGATIVE Final   Influenza B by PCR NEGATIVE NEGATIVE Final    Comment: (NOTE) The Xpert Xpress SARS-CoV-2/FLU/RSV plus assay is intended as an aid in the diagnosis of influenza from Nasopharyngeal swab specimens and should not be used as a sole basis for treatment. Nasal washings and aspirates are unacceptable for Xpert Xpress SARS-CoV-2/FLU/RSV testing.  Fact Sheet for Patients: EntrepreneurPulse.com.au  Fact Sheet for Healthcare Providers: IncredibleEmployment.be  This test is not yet approved or cleared by the [place] FDA and has been authorized for detection and/or diagnosis of SARS-CoV-2 by FDA under an Emergency Use Authorization (EUA). This EUA will remain in effect (meaning this test can be used) for the duration of the COVID-19 declaration under Section 564(b)(1) of the Act, 21 U.S.C. section 360bbb-3(b)(1), unless the authorization is terminated or revoked.  Performed at [facility], [address], [place], [place]  [address]      Time coordinating discharge:  I have spent 35 minutes face to face with the patient and on the ward discussing the patients care, assessment, plan and disposition with other care givers. >50% of the time was devoted counseling the patient about the risks and benefits of treatment/Discharge disposition and coordinating care.   SIGNED:   Damita Lack, MD  [facility] 11/26/2021, 11:23 AM   If 7PM-7AM, please contact night-coverage

## 2021-11-26 NOTE — NC FL2 (Signed)
[place] MEDICAID FL2 LEVEL OF CARE SCREENING TOOL     IDENTIFICATION  Patient Name: Rachael Rios Birthdate: 12-Apr-1951 Sex: female Admission Date (Current Location): 11/23/2021  [facility] and [place] Number:  [employer] and Address:  [place] [address] [place]      Provider Number: [id number]  Attending Physician Name and Address:  Damita Lack, MD  Relative Name and Phone Number:  Antony Salmon Niece [phone]   [phone]    Current Level of Care: Hospital Recommended Level of Care: [place] Prior Approval Number:    Date Approved/Denied:   PASRR Number: [id number]  Discharge Plan: SNF    Current Diagnoses: Patient Active Problem List   Diagnosis Date Noted   Fall 11/23/2021   Humerus fracture 11/23/2021   Wrist fracture 11/23/2021   Essential hypertension 11/23/2021   Special screening for malignant neoplasms, colon    Rotator cuff syndrome of left shoulder 02/25/2012    Orientation RESPIRATION BLADDER Height & Weight     Self, Time, Situation, Place  Normal Continent Weight: 111 lb (50.3 kg) Height:  5\' 5"  (165.1 cm)  BEHAVIORAL SYMPTOMS/MOOD NEUROLOGICAL BOWEL NUTRITION STATUS      Continent Diet (heart healthy)  AMBULATORY STATUS COMMUNICATION OF NEEDS Skin   Supervision Verbally Normal                       Personal Care Assistance Level of Assistance  Bathing, Feeding, Dressing, Total care Bathing Assistance: Maximum assistance Feeding assistance: Maximum assistance Dressing Assistance: Maximum assistance Total Care Assistance: Maximum assistance   Functional Limitations Info  Sight, Hearing, Speech Sight Info: Adequate Hearing Info: Adequate Speech Info: Adequate    SPECIAL CARE FACTORS FREQUENCY  PT (By licensed PT), OT (By licensed OT)     PT Frequency: 5x/week OT Frequency: 5x/week            Contractures Contractures Info: Not present     Additional Factors Info  Code Status, Allergies Code Status Info: Full Code Allergies Info: NKA           Current Medications (11/26/2021):  This is the current hospital active medication list Current Facility-Administered Medications  Medication Dose Route Frequency Provider Last Rate Last Admin   acetaminophen (TYLENOL) tablet 650 mg  650 mg Oral Q6H PRN Zierle-Ghosh, Asia B, DO       Or   acetaminophen (TYLENOL) suppository 650 mg  650 mg Rectal Q6H PRN Zierle-Ghosh, Asia B, DO       calcium carbonate (OS-CAL - dosed in mg of elemental calcium) tablet 1,250 mg  1,250 mg Oral BID WC Zierle-Ghosh, Asia B, DO   1,250 mg at 11/26/21 0825   dextromethorphan-guaiFENesin (MUCINEX DM) 30-600 MG per 12 hr tablet 1 tablet  1 tablet Oral BID PRN Amin, Ankit Chirag, MD       fentaNYL (SUBLIMAZE) injection 50 mcg  50 mcg Intravenous Once Zierle-Ghosh, Asia B, DO       heparin injection 5,000 Units  5,000 Units Subcutaneous Q8H Zierle-Ghosh, Asia B, DO   5,000 Units at 11/26/21 0709   hydrALAZINE (APRESOLINE) injection 10 mg  10 mg Intravenous Q4H PRN Amin, Ankit Chirag, MD       ipratropium-albuterol (DUONEB) 0.5-2.5 (3) MG/3ML nebulizer solution 3 mL  3 mL Nebulization Q4H PRN Amin, Ankit Chirag, MD       levothyroxine (SYNTHROID) tablet 50 mcg  50 mcg Oral Daily Zierle-Ghosh, Asia B, DO  50 mcg at 11/26/21 0709   losartan (COZAAR) tablet 25 mg  25 mg Oral Daily Zierle-Ghosh, Asia B, DO   25 mg at 11/26/21 0827   metoprolol tartrate (LOPRESSOR) injection 5 mg  5 mg Intravenous Q4H PRN Amin, Ankit Chirag, MD       morphine 2 MG/ML injection 2 mg  2 mg Intravenous Q3H PRN Amin, Ankit Chirag, MD       omega-3 acid ethyl esters (LOVAZA) capsule 1 g  1 g Oral Daily Zierle-Ghosh, Asia B, DO   1 g at 11/26/21 0825   ondansetron (ZOFRAN) tablet 4 mg  4 mg Oral Q6H PRN Zierle-Ghosh, Asia B, DO       Or   ondansetron (ZOFRAN) injection 4 mg  4 mg Intravenous Q6H PRN Zierle-Ghosh, Asia B, DO   4 mg at  11/23/21 2032   oxyCODONE (Oxy IR/ROXICODONE) immediate release tablet 5 mg  5 mg Oral Q4H PRN Zierle-Ghosh, Asia B, DO   5 mg at 11/24/21 2125   polyethylene glycol (MIRALAX / GLYCOLAX) packet 17 g  17 g Oral Daily PRN Zierle-Ghosh, Asia B, DO       senna-docusate (Senokot-S) tablet 1 tablet  1 tablet Oral QHS Amin, Ankit Chirag, MD   1 tablet at 11/25/21 2138   traZODone (DESYREL) tablet 50 mg  50 mg Oral QHS PRN Damita Lack, MD         Discharge Medications: Please see discharge summary for a list of discharge medications.  Relevant Imaging Results:  Relevant Lab Results:   Additional Information SSN [id number]. Per family, patient is fully vaccinated for COVID.  Breanna Mcdaniel, Clydene Pugh, LCSW

## 2021-11-26 NOTE — TOC Initial Note (Signed)
Transition of Care [facility]) - Initial/Assessment Note    Patient Details  Name: Rachael Rios MRN: [id number] Date of Birth: 12-Nov-1951  Transition of [facility]) CM/SW Contact:    Ihor Gully, LCSW Phone Number: 11/26/2021, 10:47 AM  Clinical Narrative:                 Patient from home alone. Admitted for fall that resulted in left shoulder showed impacted fracture of the surgical neck of the humerus, right wrist shows comminuted fracture of the distal radius metaphysis with apex anterior angulation. Recommended for SNF due to extensive ADL assistance needs. Patient and niece, Neoma Laming, are agreeable to SNF. Patient deferred to niece to make decisions. SNF choices were discussed to Neoma Laming and referrals made. At baseline patient is independent.   Expected Discharge Plan: Combine     Patient Goals and CMS Choice Patient states their goals for this hospitalization and ongoing recovery are:: rehab then home   Choice offered to / list presented to :  Saddie Benders, Antony Salmon)  Expected Discharge Plan and Services Expected Discharge Plan: [place]       Living arrangements for the past 2 months: Single Family Home Expected Discharge Date: 11/26/21                                    Prior Living Arrangements/Services Living arrangements for the past 2 months: Single Family Home Lives with:: Self Patient language and need for interpreter reviewed:: Yes Do you feel safe going back to the place where you live?: Yes      Need for Family Participation in Patient Care: Yes (Comment) Care giver support system in place?: No (comment)   Criminal Activity/Legal Involvement Pertinent to Current Situation/Hospitalization: No - Comment as needed  Activities of Daily Living Home Assistive Devices/Equipment: None ADL Screening (condition at time of admission) Patient's cognitive ability adequate to safely complete daily activities?: Yes Is the  patient deaf or have difficulty hearing?: No Does the patient have difficulty seeing, even when wearing glasses/contacts?: No Does the patient have difficulty concentrating, remembering, or making decisions?: No Patient able to express need for assistance with ADLs?: Yes Does the patient have difficulty dressing or bathing?: Yes Independently performs ADLs?: No Communication: Independent Dressing (OT): Dependent Is this a change from baseline?: Change from baseline, expected to last >3 days Grooming: Dependent Is this a change from baseline?: Change from baseline, expected to last >3 days Feeding: Dependent Is this a change from baseline?: Change from baseline, expected to last >3 days Bathing: Dependent Is this a change from baseline?: Change from baseline, expected to last >3 days Toileting: Needs assistance Is this a change from baseline?: Change from baseline, expected to last >3days In/Out Bed: Needs assistance Is this a change from baseline?: Change from baseline, expected to last >3 days Walks in Home: Independent Does the patient have difficulty walking or climbing stairs?: Yes Weakness of Legs: Both Weakness of Arms/Hands: Both  Permission Sought/Granted Permission sought to share information with : Family Supports    Share Information with NAME: Niece, Antony Salmon           Emotional Assessment     Affect (typically observed): Appropriate Orientation: : Oriented to Situation, Oriented to  Time, Oriented to Place, Oriented to Self Alcohol / Substance Use: Not Applicable Psych Involvement: No (comment)  Admission diagnosis:  Fall [W19.XXXA] Facial laceration, initial  encounter [S01.81XA] Minor head injury, initial encounter [S09.90XA] Closed torus fracture of distal end of right radius, initial encounter [S52.521A] Closed nondisplaced fracture of surgical neck of left humerus, unspecified fracture morphology, initial encounter [S42.215A] Patient Active Problem  List   Diagnosis Date Noted   Fall 11/23/2021   Humerus fracture 11/23/2021   Wrist fracture 11/23/2021   Essential hypertension 11/23/2021   Special screening for malignant neoplasms, colon    Rotator cuff syndrome of left shoulder 02/25/2012   PCP:  Redmond School, MD Pharmacy:   [facility] [place], [place] AT [place] [place] [address] Phone: [phone] Fax: [phone]  [id number] [place], [place] AT [place] [address] Phone: [phone] Fax: [phone]     Social Determinants of Health (SDOH) Interventions    Readmission Risk Interventions No flowsheet data found.

## 2021-11-28 DIAGNOSIS — Z9181 History of falling: Secondary | ICD-10-CM | POA: Diagnosis not present

## 2021-11-28 DIAGNOSIS — S42222D 2-part displaced fracture of surgical neck of left humerus, subsequent encounter for fracture with routine healing: Secondary | ICD-10-CM | POA: Diagnosis not present

## 2021-11-28 DIAGNOSIS — S52501D Unspecified fracture of the lower end of right radius, subsequent encounter for closed fracture with routine healing: Secondary | ICD-10-CM | POA: Diagnosis not present

## 2021-11-28 DIAGNOSIS — I10 Essential (primary) hypertension: Secondary | ICD-10-CM | POA: Diagnosis not present

## 2021-11-28 DIAGNOSIS — M6281 Muscle weakness (generalized): Secondary | ICD-10-CM | POA: Diagnosis not present

## 2021-11-28 DIAGNOSIS — E039 Hypothyroidism, unspecified: Secondary | ICD-10-CM | POA: Diagnosis not present

## 2021-11-28 DIAGNOSIS — S42202D Unspecified fracture of upper end of left humerus, subsequent encounter for fracture with routine healing: Secondary | ICD-10-CM | POA: Diagnosis not present

## 2021-11-28 DIAGNOSIS — S62101D Fracture of unspecified carpal bone, right wrist, subsequent encounter for fracture with routine healing: Secondary | ICD-10-CM | POA: Diagnosis not present

## 2021-12-05 ENCOUNTER — Telehealth: Payer: Self-pay | Admitting: Orthopedic Surgery

## 2021-12-05 NOTE — Telephone Encounter (Signed)
Noted  

## 2021-12-05 NOTE — Telephone Encounter (Signed)
Dr. Amedeo Kinsman,   We received a fax yesterday requesting to set up an appt.   Patient was seen in the ER on 11/23/21 and was consulted in the ER by Dr. Burney Gauze.    Please review notes and advised if you will see this patient.

## 2021-12-10 ENCOUNTER — Ambulatory Visit: Payer: Medicare HMO

## 2021-12-10 ENCOUNTER — Ambulatory Visit: Payer: Medicare HMO | Admitting: Orthopedic Surgery

## 2021-12-10 ENCOUNTER — Encounter: Payer: Self-pay | Admitting: Orthopedic Surgery

## 2021-12-10 ENCOUNTER — Other Ambulatory Visit: Payer: Self-pay

## 2021-12-10 VITALS — Ht 65.0 in | Wt 111.0 lb

## 2021-12-10 DIAGNOSIS — S42215A Unspecified nondisplaced fracture of surgical neck of left humerus, initial encounter for closed fracture: Secondary | ICD-10-CM

## 2021-12-10 DIAGNOSIS — S62101A Fracture of unspecified carpal bone, right wrist, initial encounter for closed fracture: Secondary | ICD-10-CM

## 2021-12-10 DIAGNOSIS — S42202A Unspecified fracture of upper end of left humerus, initial encounter for closed fracture: Secondary | ICD-10-CM

## 2021-12-10 DIAGNOSIS — W19XXXA Unspecified fall, initial encounter: Secondary | ICD-10-CM | POA: Diagnosis not present

## 2021-12-10 NOTE — Progress Notes (Addendum)
New Patient Visit  Assessment: Rachael Rios is a 71 y.o. female with the following: 1. Closed fracture of right wrist, initial encounter 2. Minimally displaced fracture of proximal end of left humerus    Plan: Patient sustained a minimally displaced fracture of the right distal radius, as well as a minimally displaced fracture of the left proximal humerus.  Both injuries remain in acceptable alignment based on today's radiographs.  We will transition her to a short arm cast for her distal radius injury.  She should remain in her sling for the left proximal humerus fracture.  Due to bilateral upper extremity involvement, she should remain in an assisted living facility, as she is unable to complete many tasks without assistance.  Follow-up in 2 weeks.    Cast application - right short arm cast   Verbal consent was obtained and the correct extremity was identified. A well padded, appropriately molded short arm cast was applied to the right arm Fingers remained warm and well perfused.   There were no sharp edges Patient tolerated the procedure well Cast care instructions were provided    Follow-up: Return in about 2 weeks (around 12/24/2021).  Subjective:  Chief Complaint  Patient presents with   Fracture    Lt shoulder and Rt wrist DOI 11/23/21    History of Present Illness: Rachael Rios is a 71 y.o. female who presents for evaluation of right wrist and left shoulder pain.  She sustained a fall on Christmas Eve, and presented to the emergency department.  Radiographs in the ED demonstrated a distal radius fracture on the right, as well as a left proximal humerus fracture.  She was discharged to an assisted living facility.  Her pain has improved.  However, her function remains poor due to bilateral upper extremity involvement.  She lives at home, and has some help available, but does not have help at all times.   Review of Systems: No fevers or chills No numbness or  tingling No chest pain No shortness of breath No bowel or bladder dysfunction No GI distress No headaches   Medical History:  Past Medical History:  Diagnosis Date   Cataract    Hypertension    PONV (postoperative nausea and vomiting)     Past Surgical History:  Procedure Laterality Date   CATARACT EXTRACTION W/PHACO Right 03/26/2015   Procedure: CATARACT EXTRACTION PHACO AND INTRAOCULAR LENS PLACEMENT ([place]);  Surgeon: Tonny Branch, MD;  Location: [facility];  Service: Ophthalmology;  Laterality: Right;  CDE 5.46   CATARACT EXTRACTION W/PHACO Left 04/26/2015   Procedure: CATARACT EXTRACTION PHACO AND INTRAOCULAR LENS PLACEMENT ([facility]);  Surgeon: Tonny Branch, MD;  Location: [facility];  Service: Ophthalmology;  Laterality: Left;  CDE 6.29   COLONOSCOPY N/A 12/07/2015   Procedure: COLONOSCOPY;  Surgeon: Daneil Dolin, MD;  Location: [facility] SUITE;  Service: Endoscopy;  Laterality: N/A;  10:30 AM - moved to 9:30 - office to notify   FINGER SURGERY Left 2000   Left middle finger   HAND SURGERY     INNER EAR SURGERY Bilateral 1977   left hand      Family History  Problem Relation Age of Onset   Diabetes Unknown    Social History   Tobacco Use   Smoking status: Never  Substance Use Topics   Alcohol use: No   Drug use: No    No Known Allergies  No outpatient medications have been marked as taking for the 12/10/21 encounter (Office Visit) with Amedeo Kinsman,  Jeannette How, MD.    Objective: Ht 5\' 5"  (1.651 m)    Wt 111 lb (50.3 kg)    BMI 18.47 kg/m   Physical Exam:  General: Alert and oriented. and No acute distress. Gait: Normal gait.  Upon removal of the splint, there is no skin breakdown on the right wrist.  There is a mild amount of swelling, and some ecchymosis.  Tenderness to palpation over the distal radius.  Sensation is intact throughout the right hand.  Evaluation of the left shoulder demonstrates some swelling, as well as ecchymosis.  Sensation is intact in the axillary nerve  distribution.  Fingers are warm and well-perfused.   IMAGING: I personally ordered and reviewed the following images  X-rays of the right wrist that demonstrates a dorsally angulated, distal radius fracture.  Joint line remains intact.  No obvious intra-articular involvement.  Compared to prior x-rays, there has been no interval subsidence.  On the lateral, the joint line is angulated dorsally.  Impression: Right distal radius fracture in acceptable alignment.  X-rays of the left shoulder were obtained in clinic today.  The glenohumeral joint remains reduced.  There is evidence of a proximal humerus fracture, with impaction of the shaft.  The articular surface remains intact.  No significant displacement of the tuberosity fragments.  Left proximal humerus fracture in acceptable alignment.  New Medications:  No orders of the defined types were placed in this encounter.     Mordecai Rasmussen, MD  12/10/2021 11:29 AM

## 2021-12-10 NOTE — Patient Instructions (Signed)
General Cast Instructions  1.  You were placed in a cast in clinic today.  Please keep the cast material clean, dry and intact.  Please do not use anything to itch the under the cast.  If it gets itchy, you can consider taking benadryl, or similar medication.  If the cast material gets wet, place it on a towel and use a hair dryer on a low setting. 2.  Tylenol or Ibuprofen/Naproxen as needed.   3.  Recommend elevating your extremity as much as possible to help with swelling. 4.  F/u 2 weeks, cast off and repeat XR   Nonweightbearing right wrist.  No lifting anything greater than a coffee cup.  Okay to work on finger range of motion.  Nonweightbearing left shoulder.  Okay for gentle left elbow and wrist range of motion.  Continue to use sling on the left arm.  Medications as needed.

## 2021-12-17 DIAGNOSIS — M6281 Muscle weakness (generalized): Secondary | ICD-10-CM | POA: Diagnosis not present

## 2021-12-17 DIAGNOSIS — S52611D Displaced fracture of right ulna styloid process, subsequent encounter for closed fracture with routine healing: Secondary | ICD-10-CM | POA: Diagnosis not present

## 2021-12-17 DIAGNOSIS — Z9181 History of falling: Secondary | ICD-10-CM | POA: Diagnosis not present

## 2021-12-17 DIAGNOSIS — E039 Hypothyroidism, unspecified: Secondary | ICD-10-CM | POA: Diagnosis not present

## 2021-12-17 DIAGNOSIS — S42213A Unspecified displaced fracture of surgical neck of unspecified humerus, initial encounter for closed fracture: Secondary | ICD-10-CM | POA: Diagnosis not present

## 2021-12-17 DIAGNOSIS — I10 Essential (primary) hypertension: Secondary | ICD-10-CM | POA: Diagnosis not present

## 2021-12-17 DIAGNOSIS — D649 Anemia, unspecified: Secondary | ICD-10-CM | POA: Diagnosis not present

## 2021-12-17 DIAGNOSIS — S52501D Unspecified fracture of the lower end of right radius, subsequent encounter for closed fracture with routine healing: Secondary | ICD-10-CM | POA: Diagnosis not present

## 2021-12-24 ENCOUNTER — Encounter: Payer: Self-pay | Admitting: Orthopedic Surgery

## 2021-12-24 ENCOUNTER — Other Ambulatory Visit: Payer: Self-pay

## 2021-12-24 ENCOUNTER — Ambulatory Visit: Payer: Medicare HMO

## 2021-12-24 ENCOUNTER — Ambulatory Visit (INDEPENDENT_AMBULATORY_CARE_PROVIDER_SITE_OTHER): Payer: Medicare HMO | Admitting: Orthopedic Surgery

## 2021-12-24 DIAGNOSIS — S42202A Unspecified fracture of upper end of left humerus, initial encounter for closed fracture: Secondary | ICD-10-CM

## 2021-12-24 DIAGNOSIS — M6281 Muscle weakness (generalized): Secondary | ICD-10-CM | POA: Diagnosis not present

## 2021-12-24 DIAGNOSIS — S62101D Fracture of unspecified carpal bone, right wrist, subsequent encounter for fracture with routine healing: Secondary | ICD-10-CM

## 2021-12-24 DIAGNOSIS — S42222D 2-part displaced fracture of surgical neck of left humerus, subsequent encounter for fracture with routine healing: Secondary | ICD-10-CM | POA: Diagnosis not present

## 2021-12-24 DIAGNOSIS — R2689 Other abnormalities of gait and mobility: Secondary | ICD-10-CM | POA: Diagnosis not present

## 2021-12-24 DIAGNOSIS — S52501D Unspecified fracture of the lower end of right radius, subsequent encounter for closed fracture with routine healing: Secondary | ICD-10-CM | POA: Diagnosis not present

## 2021-12-24 DIAGNOSIS — E440 Moderate protein-calorie malnutrition: Secondary | ICD-10-CM | POA: Diagnosis not present

## 2021-12-24 DIAGNOSIS — R262 Difficulty in walking, not elsewhere classified: Secondary | ICD-10-CM | POA: Diagnosis not present

## 2021-12-24 NOTE — Progress Notes (Signed)
Return patient Visit  Assessment: Rachael Rios is a 71 y.o. female with the following: 1. Closed fracture of right wrist, initial encounter 2. Minimally displaced fracture of proximal end of left humerus    Plan: Her pain continues to improve.  Radiographs are stable.  We will transition her out of the cast, and into a removable wrist splint.  Continue with the sling while upright.  Cast removed, transition to a removable wrist splint.  Wear splint at all times, except for hygiene.  Ok to start with gentle range of motion of the right wrist.  Continue with left arm sling.  Ok to remove for hygiene.  Can remove at bed time as well, if comfortable.  Should continue to wear while upright and walking.  If sitting around, can remove sling.   Ok for ROM of the left elbow, wrist and hand.  Shoulder pendulums ok.  Limited ROM of the shoulder otherwise.   Follow-up: Return in about 2 weeks (around 01/07/2022).  Subjective:  Chief Complaint  Patient presents with   Fracture    Rt wrist, Lt shoulder DOI 11/23/21    History of Present Illness: Rachael Rios is a 71 y.o. female who presents for repeat evaluation of right wrist and left shoulder pain.  She sustained a fall approximately 1 month ago.  She has remained in an assisted living facility due to bilateral upper extremity involvement.  Her pain continues to improve.  She is working on range of motion of her fingers.  She tolerated the cast well.  She has done well with her sling.  Review of Systems: No fevers or chills No numbness or tingling No chest pain No shortness of breath No bowel or bladder dysfunction No GI distress No headaches  Objective: There were no vitals taken for this visit.  Physical Exam:  General: Alert and oriented. and No acute distress. Gait: Normal gait.  Upon removal of the splint, there is no skin breakdown on the right wrist.  No swelling or ecchymosis around the wrist.  Sensation is intact  throughout the right hand.  She tolerates gentle range of motion of the wrist with minimal discomfort.  She can make a full fist.  Evaluation left shoulder demonstrates no obvious deformity.  Improved swelling and bruising.  Sensations intact throughout the left hand.  2+ radial pulse.  IMAGING: I personally ordered and reviewed the following images  X-rays of the right wrist were obtained in clinic today, compared to previous x-rays.  X-rays remain unchanged.  No interval subsidence.  No further displacement compared to prior x-rays.  Joint line remains dorsally angulated.  Impression: Healing right distal radius fracture in stable alignment.   X-rays of the left shoulder were obtained in clinic today.  These were compared to prior x-rays.  There is been no interval displacement of the fractures.  Glenohumeral joint remains reduced.  There is mild impaction of the shaft, within the humeral head.  Consistent with prior x-rays.  There is some interval consolidation at the fracture site.  No further displacement.  Impression: Left proximal humerus fracture in acceptable alignment.  New Medications:  No orders of the defined types were placed in this encounter.     Mordecai Rasmussen, MD  12/24/2021 12:24 PM

## 2021-12-24 NOTE — Patient Instructions (Signed)
Cast removed, transition to a removable wrist splint.  Wear splint at all times, except for hygiene.  Ok to start with gentle range of motion of the right wrist.  Continue with left arm sling.  Ok to remove for hygiene.  Can remove at bed time as well, if comfortable.  Should continue to wear while upright and walking.  If sitting around, can remove sling.   Ok to ROM of the left elbow, wrist and hand.  Shoulder pendulums ok.  Limited ROM of the shoulder otherwise.

## 2021-12-25 DIAGNOSIS — S42222D 2-part displaced fracture of surgical neck of left humerus, subsequent encounter for fracture with routine healing: Secondary | ICD-10-CM | POA: Diagnosis not present

## 2021-12-25 DIAGNOSIS — R2689 Other abnormalities of gait and mobility: Secondary | ICD-10-CM | POA: Diagnosis not present

## 2021-12-25 DIAGNOSIS — S52501D Unspecified fracture of the lower end of right radius, subsequent encounter for closed fracture with routine healing: Secondary | ICD-10-CM | POA: Diagnosis not present

## 2021-12-25 DIAGNOSIS — M6281 Muscle weakness (generalized): Secondary | ICD-10-CM | POA: Diagnosis not present

## 2021-12-25 DIAGNOSIS — E440 Moderate protein-calorie malnutrition: Secondary | ICD-10-CM | POA: Diagnosis not present

## 2021-12-25 DIAGNOSIS — R262 Difficulty in walking, not elsewhere classified: Secondary | ICD-10-CM | POA: Diagnosis not present

## 2021-12-26 DIAGNOSIS — M6281 Muscle weakness (generalized): Secondary | ICD-10-CM | POA: Diagnosis not present

## 2021-12-26 DIAGNOSIS — R262 Difficulty in walking, not elsewhere classified: Secondary | ICD-10-CM | POA: Diagnosis not present

## 2021-12-26 DIAGNOSIS — E440 Moderate protein-calorie malnutrition: Secondary | ICD-10-CM | POA: Diagnosis not present

## 2021-12-26 DIAGNOSIS — S42222D 2-part displaced fracture of surgical neck of left humerus, subsequent encounter for fracture with routine healing: Secondary | ICD-10-CM | POA: Diagnosis not present

## 2021-12-26 DIAGNOSIS — S52501D Unspecified fracture of the lower end of right radius, subsequent encounter for closed fracture with routine healing: Secondary | ICD-10-CM | POA: Diagnosis not present

## 2021-12-26 DIAGNOSIS — R2689 Other abnormalities of gait and mobility: Secondary | ICD-10-CM | POA: Diagnosis not present

## 2021-12-27 DIAGNOSIS — M6281 Muscle weakness (generalized): Secondary | ICD-10-CM | POA: Diagnosis not present

## 2021-12-27 DIAGNOSIS — E440 Moderate protein-calorie malnutrition: Secondary | ICD-10-CM | POA: Diagnosis not present

## 2021-12-27 DIAGNOSIS — S42222D 2-part displaced fracture of surgical neck of left humerus, subsequent encounter for fracture with routine healing: Secondary | ICD-10-CM | POA: Diagnosis not present

## 2021-12-27 DIAGNOSIS — S52501D Unspecified fracture of the lower end of right radius, subsequent encounter for closed fracture with routine healing: Secondary | ICD-10-CM | POA: Diagnosis not present

## 2021-12-27 DIAGNOSIS — R2689 Other abnormalities of gait and mobility: Secondary | ICD-10-CM | POA: Diagnosis not present

## 2021-12-27 DIAGNOSIS — R262 Difficulty in walking, not elsewhere classified: Secondary | ICD-10-CM | POA: Diagnosis not present

## 2021-12-28 DIAGNOSIS — E440 Moderate protein-calorie malnutrition: Secondary | ICD-10-CM | POA: Diagnosis not present

## 2021-12-28 DIAGNOSIS — S52501D Unspecified fracture of the lower end of right radius, subsequent encounter for closed fracture with routine healing: Secondary | ICD-10-CM | POA: Diagnosis not present

## 2021-12-28 DIAGNOSIS — M6281 Muscle weakness (generalized): Secondary | ICD-10-CM | POA: Diagnosis not present

## 2021-12-28 DIAGNOSIS — S42222D 2-part displaced fracture of surgical neck of left humerus, subsequent encounter for fracture with routine healing: Secondary | ICD-10-CM | POA: Diagnosis not present

## 2021-12-28 DIAGNOSIS — R2689 Other abnormalities of gait and mobility: Secondary | ICD-10-CM | POA: Diagnosis not present

## 2021-12-28 DIAGNOSIS — R262 Difficulty in walking, not elsewhere classified: Secondary | ICD-10-CM | POA: Diagnosis not present

## 2021-12-30 DIAGNOSIS — S52501D Unspecified fracture of the lower end of right radius, subsequent encounter for closed fracture with routine healing: Secondary | ICD-10-CM | POA: Diagnosis not present

## 2021-12-30 DIAGNOSIS — E440 Moderate protein-calorie malnutrition: Secondary | ICD-10-CM | POA: Diagnosis not present

## 2021-12-30 DIAGNOSIS — M6281 Muscle weakness (generalized): Secondary | ICD-10-CM | POA: Diagnosis not present

## 2021-12-30 DIAGNOSIS — E039 Hypothyroidism, unspecified: Secondary | ICD-10-CM | POA: Diagnosis not present

## 2021-12-30 DIAGNOSIS — R262 Difficulty in walking, not elsewhere classified: Secondary | ICD-10-CM | POA: Diagnosis not present

## 2021-12-30 DIAGNOSIS — R2689 Other abnormalities of gait and mobility: Secondary | ICD-10-CM | POA: Diagnosis not present

## 2021-12-30 DIAGNOSIS — I10 Essential (primary) hypertension: Secondary | ICD-10-CM | POA: Diagnosis not present

## 2021-12-30 DIAGNOSIS — S42222D 2-part displaced fracture of surgical neck of left humerus, subsequent encounter for fracture with routine healing: Secondary | ICD-10-CM | POA: Diagnosis not present

## 2021-12-30 DIAGNOSIS — S52611D Displaced fracture of right ulna styloid process, subsequent encounter for closed fracture with routine healing: Secondary | ICD-10-CM | POA: Diagnosis not present

## 2021-12-30 DIAGNOSIS — S42213A Unspecified displaced fracture of surgical neck of unspecified humerus, initial encounter for closed fracture: Secondary | ICD-10-CM | POA: Diagnosis not present

## 2021-12-31 DIAGNOSIS — M6281 Muscle weakness (generalized): Secondary | ICD-10-CM | POA: Diagnosis not present

## 2022-01-01 DIAGNOSIS — E440 Moderate protein-calorie malnutrition: Secondary | ICD-10-CM | POA: Diagnosis not present

## 2022-01-01 DIAGNOSIS — S42202D Unspecified fracture of upper end of left humerus, subsequent encounter for fracture with routine healing: Secondary | ICD-10-CM | POA: Diagnosis not present

## 2022-01-01 DIAGNOSIS — S42222D 2-part displaced fracture of surgical neck of left humerus, subsequent encounter for fracture with routine healing: Secondary | ICD-10-CM | POA: Diagnosis not present

## 2022-01-01 DIAGNOSIS — Z9181 History of falling: Secondary | ICD-10-CM | POA: Diagnosis not present

## 2022-01-01 DIAGNOSIS — E039 Hypothyroidism, unspecified: Secondary | ICD-10-CM | POA: Diagnosis not present

## 2022-01-01 DIAGNOSIS — E785 Hyperlipidemia, unspecified: Secondary | ICD-10-CM | POA: Diagnosis not present

## 2022-01-01 DIAGNOSIS — I10 Essential (primary) hypertension: Secondary | ICD-10-CM | POA: Diagnosis not present

## 2022-01-01 DIAGNOSIS — S52501D Unspecified fracture of the lower end of right radius, subsequent encounter for closed fracture with routine healing: Secondary | ICD-10-CM | POA: Diagnosis not present

## 2022-01-01 DIAGNOSIS — M6281 Muscle weakness (generalized): Secondary | ICD-10-CM | POA: Diagnosis not present

## 2022-01-01 DIAGNOSIS — S62101D Fracture of unspecified carpal bone, right wrist, subsequent encounter for fracture with routine healing: Secondary | ICD-10-CM | POA: Diagnosis not present

## 2022-01-10 ENCOUNTER — Ambulatory Visit (INDEPENDENT_AMBULATORY_CARE_PROVIDER_SITE_OTHER): Payer: Medicare HMO | Admitting: Orthopedic Surgery

## 2022-01-10 ENCOUNTER — Encounter: Payer: Self-pay | Admitting: Orthopedic Surgery

## 2022-01-10 ENCOUNTER — Ambulatory Visit: Payer: Medicare HMO

## 2022-01-10 ENCOUNTER — Other Ambulatory Visit: Payer: Self-pay

## 2022-01-10 VITALS — Ht 65.0 in | Wt 111.0 lb

## 2022-01-10 DIAGNOSIS — S42202A Unspecified fracture of upper end of left humerus, initial encounter for closed fracture: Secondary | ICD-10-CM

## 2022-01-10 DIAGNOSIS — S62101D Fracture of unspecified carpal bone, right wrist, subsequent encounter for fracture with routine healing: Secondary | ICD-10-CM | POA: Diagnosis not present

## 2022-01-10 NOTE — Patient Instructions (Addendum)
OK to start range of motion for both the wrist and the shoulder.   Limit lifting use the right wrist and the left shoulder.   As ROM improves, can start to increase lifting and strengthening

## 2022-01-10 NOTE — Progress Notes (Signed)
Return patient Visit  Assessment: Rachael Rios is a 71 y.o. female with the following: 1. Closed fracture of right wrist 2. Minimally displaced fracture of proximal end of left humerus    Plan: Radiographs are stable.  Her pain is improving.  Okay to come out of the wrist brace, start working on range of motion.  She can stop using the sling on the left arm.  Okay to advance her range of motion with the assistance of home health PT.  As her range of motion improves, she can increase her strengthening.  Follow-up in 6 weeks for repeat evaluation.   Follow-up: Return in about 6 weeks (around 02/21/2022).  Subjective:  Chief Complaint  Patient presents with   Fracture    Rt wrist and Lt shoulder DOI 11/23/21    History of Present Illness: Rachael Rios is a 71 y.o. female who presents for repeat evaluation of right wrist and left shoulder pain.  She sustained a fall approximately 6 weeks ago.  She has been discharged home, from the assisted living facility.  Home health physical therapy has started to come to her house.  Her pain continues to improve.  She has tolerated the removable wrist brace.  No numbness or tingling.  She continues to use the sling when upright.  Review of Systems: No fevers or chills No numbness or tingling No chest pain No shortness of breath No bowel or bladder dysfunction No GI distress No headaches  Objective: Ht 5\' 5"  (1.651 m)    Wt 111 lb (50.3 kg)    BMI 18.47 kg/m   Physical Exam:  General: Alert and oriented. and No acute distress. Gait: Normal gait.  No skin breakdown upon removal of the brace.  Mild tenderness to palpation over the distal radius.  Fingers are short of a full fist.  She tolerates gentle range of motion of the wrist.  Limited supination.  Evaluation left shoulder demonstrates no obvious deformity.  Sensations intact throughout the left hand.  2+ radial pulse.  IMAGING: I personally ordered and reviewed the following  images  X-rays of the right wrist were obtained in clinic today.  These were compared to prior x-rays.  There is been no interval displacement of the fracture.  Joint line remains dorsally angulated.  Impression: Healing right distal radius fracture, in acceptable alignment.   X-rays the left shoulder were obtained in clinic today.  There has been no further displacement of the proximal humerus fracture.  The glenohumeral joint is reduced.  There has been interval consolidation at the fracture site.  Impression: Healing left proximal humerus fracture in acceptable alignment.  New Medications:  No orders of the defined types were placed in this encounter.     Mordecai Rasmussen, MD  01/10/2022 1:34 PM

## 2022-01-14 DIAGNOSIS — H7202 Central perforation of tympanic membrane, left ear: Secondary | ICD-10-CM | POA: Diagnosis not present

## 2022-01-14 DIAGNOSIS — H6123 Impacted cerumen, bilateral: Secondary | ICD-10-CM | POA: Diagnosis not present

## 2022-01-14 DIAGNOSIS — H6982 Other specified disorders of Eustachian tube, left ear: Secondary | ICD-10-CM | POA: Diagnosis not present

## 2022-01-20 DIAGNOSIS — S62101D Fracture of unspecified carpal bone, right wrist, subsequent encounter for fracture with routine healing: Secondary | ICD-10-CM | POA: Diagnosis not present

## 2022-01-20 DIAGNOSIS — S52501D Unspecified fracture of the lower end of right radius, subsequent encounter for closed fracture with routine healing: Secondary | ICD-10-CM | POA: Diagnosis not present

## 2022-01-20 DIAGNOSIS — S42222D 2-part displaced fracture of surgical neck of left humerus, subsequent encounter for fracture with routine healing: Secondary | ICD-10-CM | POA: Diagnosis not present

## 2022-01-20 DIAGNOSIS — S42202D Unspecified fracture of upper end of left humerus, subsequent encounter for fracture with routine healing: Secondary | ICD-10-CM | POA: Diagnosis not present

## 2022-02-21 ENCOUNTER — Other Ambulatory Visit: Payer: Self-pay

## 2022-02-21 ENCOUNTER — Ambulatory Visit: Payer: Medicare HMO

## 2022-02-21 ENCOUNTER — Encounter: Payer: Self-pay | Admitting: Orthopedic Surgery

## 2022-02-21 ENCOUNTER — Ambulatory Visit (INDEPENDENT_AMBULATORY_CARE_PROVIDER_SITE_OTHER): Payer: Medicare HMO | Admitting: Orthopedic Surgery

## 2022-02-21 VITALS — Ht 65.0 in | Wt 111.0 lb

## 2022-02-21 DIAGNOSIS — S62101D Fracture of unspecified carpal bone, right wrist, subsequent encounter for fracture with routine healing: Secondary | ICD-10-CM | POA: Diagnosis not present

## 2022-02-21 DIAGNOSIS — S42202A Unspecified fracture of upper end of left humerus, initial encounter for closed fracture: Secondary | ICD-10-CM

## 2022-02-21 NOTE — Progress Notes (Signed)
Return patient Visit ? ?Assessment: ?Rachael Rios is a 71 y.o. female with the following: ?1. Closed fracture of right wrist ?2. Minimally displaced fracture of proximal end of left humerus ? ? ? ?Plan: ?Repeat radiographs are stable.  She has been working with physical therapy.  She is a little stiff at this point, and I have urged her to continue working diligently.  I provided her with home exercises for her right wrist, as well as her left shoulder.  As she start strengthening more, she will notice improvements in her range of motion and function.  No follow-up needed at this time, they will call the clinic if they have issues. ? ?Follow-up: ?Return if symptoms worsen or fail to improve. ? ?Subjective: ? ?Chief Complaint  ?Patient presents with  ? Fracture  ?  Lt shoulder and Rt wrist DOI 11/23/21  ? ? ?History of Present Illness: ?Rachael Rios is a 71 y.o. female who presents for repeat evaluation of right wrist and left shoulder pain.  She sustained a fall approximately 3 months ago.  She has been working diligently with therapy.  Her range of motion is improving.  She has some soreness in the left shoulder.  She also has stiffness and minimal pain in the right wrist.  She states that they have been focusing on movement of her left shoulder, and less so on the movement of her right wrist.  She also notes catching of a right long finger, consistent with triggering. ? ? ?Review of Systems: ?No fevers or chills ?No numbness or tingling ?No chest pain ?No shortness of breath ?No bowel or bladder dysfunction ?No GI distress ?No headaches ? ?Objective: ?Ht '5\' 5"'$  (1.651 m)   Wt 111 lb (50.3 kg)   BMI 18.47 kg/m?  ? ?Physical Exam: ? ?General: Alert and oriented. and No acute distress. ?Gait: Normal gait. ? ?Right wrist with mild deformity overall.  No swelling is appreciated.  40 degrees of extension, 20 degrees of flexion.  She does not have full pronation or supination at this point.  Fingers are warm and  well-perfused.  Grip strength 4/5. ? ?Evaluation left shoulder demonstrates no obvious deformity.  Sensation is intact throughout the left hand.  2+ radial pulse. ? ?IMAGING: ?I personally ordered and reviewed the following images ? ?X-ray of the right wrist was obtained in clinic today.  Slight dorsal tilt of the joint line.  There has been interval consolidation at the distal radius fracture site.  No acute injuries are noted. ? ?Impression: Healing right distal radius fracture ? ?X-ray of the left shoulder was obtained in clinic today.  There has been no interval displacement of the proximal humerus fracture.  Glenohumeral joint is reduced.  Mild impaction of the shaft and the articular fragment.  There is evidence of healing. ? ?Impression: Healing left proximal humerus fracture. ? ?New Medications:  ?No orders of the defined types were placed in this encounter. ? ? ? ? ?Mordecai Rasmussen, MD ? ?02/21/2022 ?11:21 AM ? ? ?

## 2022-02-21 NOTE — Patient Instructions (Signed)
Wrist Fracture Rehab ?Ask your health care provider which exercises are safe for you. Do exercises exactly as told by your health care provider and adjust them as directed. It is normal to feel mild stretching, pulling, tightness, or discomfort as you do these exercises. Stop right away if you feel sudden pain or your pain gets worse. Do not begin these exercises until told by your health care provider. ?Stretching and range-of-motion exercises ?These exercises warm up your muscles and joints and improve the movement and flexibility of your wrist and hand. These exercises also help to relieve pain,numbness, and tingling. ?Finger flexion and extension ?Sit or stand with your elbow at your side. ?Open and stretch your left / right fingers as wide as you can (extension). ?Hold this position for 10 seconds. ?Close your left / right fingers into a gentle fist (flexion). ?Hold this position for 10 seconds. ?Slowly return to the starting position. ?Repeat 10 times. Complete this exercise 1-2 times a day. ?Wrist flexion ?Bend your left / right elbow to a 90-degree angle (right angle) with your palm facing the floor. ?Bend your wrist forward so your fingers point toward the floor (flexion). ?Hold this position for 10 seconds. ?Slowly return to the starting position. ?Repeat 10 times. Complete this exercise 1-2 times a day. ?Wrist extension ?Bend your left / right elbow to a 90-degree angle (right angle) with your palm facing the floor. ?Bend your wrist backward so your fingers point toward the ceiling (extension). ?Hold this position for 10 seconds. ?Slowly return to the starting position. ?Repeat 10 times. Complete this exercise 1-2 times a day. ?Ulnar deviation ?Bend your left / right elbow to a 90-degree angle (right angle), and rest your forearm on a table with your palm facing down. ?Keeping your hand flat on the table, bend your left / right wrist toward your small finger (pinkie). This is ulnar deviation. ?Hold this  position for 10 seconds. ?Slowly return to the starting position. ?Repeat 10 times. Complete this exercise 1-2 times a day. ?Radial deviation ?Bend your left / right elbow to a 90-degree angle (right angle), and rest your forearm on a table with your palm facing down. ?Keeping your hand flat on the table, bend your left / right wrist toward your thumb. This is radial deviation. ?Hold this position for 10 seconds. ?Slowly return to the starting position. ?Repeat 10 times. Complete this exercise 1-2 times a day. ?Forearm rotation, supination ?Stand or sit with your left / right elbow bent to a 90-degree angle (right angle) at your side. Position your forearm so that the thumb is facing the ceiling (neutral position). ?Turn (rotate) your palm up toward the ceiling (supination), stopping when you feel a gentle stretch. ?Hold this position for 10 seconds. ?Slowly return to the starting position. ?Repeat 10 times. Complete this exercise 1-2 times a day. ?Forearm rotation, pronation ?Stand or sit with your left / right elbow bent to a 90-degree angle (right angle) at your side. Position your forearm so that the thumb is facing the ceiling (neutral position). ?Turn (rotate) your palm down toward the floor (pronation), stopping when you feel a gentle stretch. ?Hold this position for 10 seconds. ?Slowly return to the starting position. ?Repeat 10 times. Complete this exercise 1-2 times a day. ?Wrist flexion stretch ? ?Extend your left / right arm in front of you and turn your palm down toward the floor. ?If told by your health care provider, bend your left / right arm to a 90-degree angle (  right angle) at your side. ?Using your uninjured hand, gently press over the back of your left / right hand to bend your wrist and fingers toward the floor (flexion). Go as far as you can to feel a stretch without causing pain. ?Hold this position for 10 seconds. ?Slowly return to the starting position. ?Repeat 10 times. Complete this  exercise 1-2 times a day. ?Wrist extension stretch ? ?Extend your left / right arm in front of you and turn your palm up toward the ceiling. ?If told by your health care provider, bend your left / right arm to a 90-degree angle (right angle) at your side. ?Using your uninjured hand, gently press over the palm of your left / right hand to bend your wrist and fingers toward the floor (extension). Go as far as you can to feel a stretch without causing pain. ?Hold this position for 10 seconds. ?Slowly return to the starting position. ?Repeat 10 times. Complete this exercise 1-2 times a day. ?Forearm rotation stretch, supination ?Stand or sit with your arms at your sides. ?Bend your left / right elbow to a 90-degree angle (right angle). ?Using your uninjured hand, turn your left / right palm up toward the ceiling (assisted supination) until you feel a gentle stretch in the inside of your forearm. ?Hold this position for 10 seconds. ?Slowly return to the starting position. ?Repeat 10 times. Complete this exercise 1-2 times a day. ?Forearm rotation stretch, pronation ?Stand or sit with your arms at your sides. ?Bend your left / right elbow to a 90-degree angle (right angle). ?Using your uninjured hand, turn your left / right palm down toward the floor (assisted pronation) until you feel a gentle stretch in the top of your forearm. ?Hold this position for 10 seconds. ?Slowly return to the starting position. ?Repeat 10 times. Complete this exercise 1-2 times a day. ?Strengthening exercises ?These exercises build strength and endurance in your wrist and hand. Enduranceis the ability to use your muscles for a long time, even after they get tired. ?Wrist flexion ?Sit with your left / right forearm supported on a table. Your elbow should be at waist height. ?Rest your hand over the edge of the table, palm up. ?Gently grasp a 5 lb / kg weight (can of soup). Or, hold an exercise band or tube in both hands, keeping your hands at  the same level and hip distance apart. There should be slight tension in the exercise band or tube. ?Without moving your forearm or elbow, slowly bend your wrist up toward the ceiling (wrist flexion). ?Hold this position for 10 seconds. ?Slowly return to the starting position. ?Repeat 10 times. Complete this exercise 1-2 times a day. ?Wrist extension ?Sit with your left / right forearm supported on a table. Your elbow should be at waist height. ?Rest your hand over the edge of the table, palm down. ?Gently grasp a 5 lb / kg weight. Or, hold an exercise band or tube in both hands, keeping your hands at the same level and hip distance apart. There should be slight tension in the exercise band or tube. ?Without moving your forearm or elbow, slowly curl your hand up toward the ceiling (extension). ?Hold this position for 10 seconds. ?Slowly return to the starting position. ?Repeat 10 times. Complete this exercise 1-2 times a day. ?Forearm rotation, supination ? ?Sit with your left / right forearm supported on a table. Your elbow should be at waist height. ?Rest your hand over the edge of the   table, palm down. ?Gently grasp a lightweight hammer near the head. As this exercise gets easier for you, try holding the hammer farther down the handle. ?Without moving your elbow, slowly turn (rotate) your palm up toward the ceiling (supination). ?Hold this position for 10 seconds. ?Slowly return to the starting position. ?Repeat 10 times. Complete this exercise 1-2 times a day. ?Forearm rotation, pronation ? ?Sit with your left / right forearm supported on a table. Your elbow should be at waist height. ?Rest your hand over the edge of the table, palm up. ?Gently grasp a lightweight hammer near the head. As this exercise gets easier for you, try holding the hammer farther down the handle. ?Without moving your elbow, slowly turn (rotate) your palm down toward the floor (pronation). ?Hold this position for 10 seconds. ?Slowly return  to the starting position. ?Repeat 10 times. Complete this exercise 1-2 times a day. ?Grip strengthening ? ?Hold one of these items in your left / right hand: a dense sponge, a stress ball, or a large, rolled

## 2022-05-13 ENCOUNTER — Ambulatory Visit: Payer: Medicare HMO | Admitting: Orthopedic Surgery

## 2022-05-13 ENCOUNTER — Encounter: Payer: Self-pay | Admitting: Orthopedic Surgery

## 2022-05-13 VITALS — Ht 65.0 in | Wt 111.0 lb

## 2022-05-13 DIAGNOSIS — M65331 Trigger finger, right middle finger: Secondary | ICD-10-CM | POA: Diagnosis not present

## 2022-05-13 DIAGNOSIS — S62101D Fracture of unspecified carpal bone, right wrist, subsequent encounter for fracture with routine healing: Secondary | ICD-10-CM

## 2022-05-13 NOTE — Patient Instructions (Signed)
Instructions Following Joint Injections  In clinic today, you received an injection in one of your joints (sometimes more than one).  Occasionally, you can have some pain at the injection site, this is normal.  You can place ice at the injection site, or take over-the-counter medications such as Tylenol (acetaminophen) or Advil (ibuprofen).  Please follow all directions listed on the bottle.  If your joint (knee or shoulder) becomes swollen, red or very painful, please contact the clinic for additional assistance.   Two medications were injected, including lidocaine and a steroid (often referred to as cortisone).  Lidocaine is effective almost immediately but wears off quickly.  However, the steroid can take a few days to improve your symptoms.  In some cases, it can make your pain worse for a couple of days.  Do not be concerned if this happens as it is common.  You can apply ice or take some over-the-counter medications as needed.     Wrist Fracture Rehab Ask your health care provider which exercises are safe for you. Do exercises exactly as told by your health care provider and adjust them as directed. It is normal to feel mild stretching, pulling, tightness, or discomfort as you do these exercises. Stop right away if you feel sudden pain or your pain gets worse. Do not begin these exercises until told by your health care provider. Stretching and range-of-motion exercises These exercises warm up your muscles and joints and improve the movement and flexibility of your wrist and hand. These exercises also help to relieve pain,numbness, and tingling. Finger flexion and extension Sit or stand with your elbow at your side. Open and stretch your left / right fingers as wide as you can (extension). Hold this position for 10 seconds. Close your left / right fingers into a gentle fist (flexion). Hold this position for 10 seconds. Slowly return to the starting position. Repeat 10 times. Complete this  exercise 1-2 times a day. Wrist flexion Bend your left / right elbow to a 90-degree angle (right angle) with your palm facing the floor. Bend your wrist forward so your fingers point toward the floor (flexion). Hold this position for 10 seconds. Slowly return to the starting position. Repeat 10 times. Complete this exercise 1-2 times a day. Wrist extension Bend your left / right elbow to a 90-degree angle (right angle) with your palm facing the floor. Bend your wrist backward so your fingers point toward the ceiling (extension). Hold this position for 10 seconds. Slowly return to the starting position. Repeat 10 times. Complete this exercise 1-2 times a day. Ulnar deviation Bend your left / right elbow to a 90-degree angle (right angle), and rest your forearm on a table with your palm facing down. Keeping your hand flat on the table, bend your left / right wrist toward your small finger (pinkie). This is ulnar deviation. Hold this position for 10 seconds. Slowly return to the starting position. Repeat 10 times. Complete this exercise 1-2 times a day. Radial deviation Bend your left / right elbow to a 90-degree angle (right angle), and rest your forearm on a table with your palm facing down. Keeping your hand flat on the table, bend your left / right wrist toward your thumb. This is radial deviation. Hold this position for 10 seconds. Slowly return to the starting position. Repeat 10 times. Complete this exercise 1-2 times a day. Forearm rotation, supination Stand or sit with your left / right elbow bent to a 90-degree angle (right angle)  at your side. Position your forearm so that the thumb is facing the ceiling (neutral position). Turn (rotate) your palm up toward the ceiling (supination), stopping when you feel a gentle stretch. Hold this position for 10 seconds. Slowly return to the starting position. Repeat 10 times. Complete this exercise 1-2 times a day. Forearm rotation,  pronation Stand or sit with your left / right elbow bent to a 90-degree angle (right angle) at your side. Position your forearm so that the thumb is facing the ceiling (neutral position). Turn (rotate) your palm down toward the floor (pronation), stopping when you feel a gentle stretch. Hold this position for 10 seconds. Slowly return to the starting position. Repeat 10 times. Complete this exercise 1-2 times a day. Wrist flexion stretch  Extend your left / right arm in front of you and turn your palm down toward the floor. If told by your health care provider, bend your left / right arm to a 90-degree angle (right angle) at your side. Using your uninjured hand, gently press over the back of your left / right hand to bend your wrist and fingers toward the floor (flexion). Go as far as you can to feel a stretch without causing pain. Hold this position for 10 seconds. Slowly return to the starting position. Repeat 10 times. Complete this exercise 1-2 times a day. Wrist extension stretch  Extend your left / right arm in front of you and turn your palm up toward the ceiling. If told by your health care provider, bend your left / right arm to a 90-degree angle (right angle) at your side. Using your uninjured hand, gently press over the palm of your left / right hand to bend your wrist and fingers toward the floor (extension). Go as far as you can to feel a stretch without causing pain. Hold this position for 10 seconds. Slowly return to the starting position. Repeat 10 times. Complete this exercise 1-2 times a day. Forearm rotation stretch, supination Stand or sit with your arms at your sides. Bend your left / right elbow to a 90-degree angle (right angle). Using your uninjured hand, turn your left / right palm up toward the ceiling (assisted supination) until you feel a gentle stretch in the inside of your forearm. Hold this position for 10 seconds. Slowly return to the starting  position. Repeat 10 times. Complete this exercise 1-2 times a day. Forearm rotation stretch, pronation Stand or sit with your arms at your sides. Bend your left / right elbow to a 90-degree angle (right angle). Using your uninjured hand, turn your left / right palm down toward the floor (assisted pronation) until you feel a gentle stretch in the top of your forearm. Hold this position for 10 seconds. Slowly return to the starting position. Repeat 10 times. Complete this exercise 1-2 times a day. Strengthening exercises These exercises build strength and endurance in your wrist and hand. Enduranceis the ability to use your muscles for a long time, even after they get tired. Wrist flexion Sit with your left / right forearm supported on a table. Your elbow should be at waist height. Rest your hand over the edge of the table, palm up. Gently grasp a 5 lb / kg weight (can of soup). Or, hold an exercise band or tube in both hands, keeping your hands at the same level and hip distance apart. There should be slight tension in the exercise band or tube. Without moving your forearm or elbow, slowly bend your wrist  up toward the ceiling (wrist flexion). Hold this position for 10 seconds. Slowly return to the starting position. Repeat 10 times. Complete this exercise 1-2 times a day. Wrist extension Sit with your left / right forearm supported on a table. Your elbow should be at waist height. Rest your hand over the edge of the table, palm down. Gently grasp a 5 lb / kg weight. Or, hold an exercise band or tube in both hands, keeping your hands at the same level and hip distance apart. There should be slight tension in the exercise band or tube. Without moving your forearm or elbow, slowly curl your hand up toward the ceiling (extension). Hold this position for 10 seconds. Slowly return to the starting position. Repeat 10 times. Complete this exercise 1-2 times a day. Forearm rotation,  supination  Sit with your left / right forearm supported on a table. Your elbow should be at waist height. Rest your hand over the edge of the table, palm down. Gently grasp a lightweight hammer near the head. As this exercise gets easier for you, try holding the hammer farther down the handle. Without moving your elbow, slowly turn (rotate) your palm up toward the ceiling (supination). Hold this position for 10 seconds. Slowly return to the starting position. Repeat 10 times. Complete this exercise 1-2 times a day. Forearm rotation, pronation  Sit with your left / right forearm supported on a table. Your elbow should be at waist height. Rest your hand over the edge of the table, palm up. Gently grasp a lightweight hammer near the head. As this exercise gets easier for you, try holding the hammer farther down the handle. Without moving your elbow, slowly turn (rotate) your palm down toward the floor (pronation). Hold this position for 10 seconds. Slowly return to the starting position. Repeat 10 times. Complete this exercise 1-2 times a day. Grip strengthening  Hold one of these items in your left / right hand: a dense sponge, a stress ball, or a large, rolled sock. Slowly squeeze the object as hard as you can without increasing any pain. Hold your squeeze for 10 seconds. Slowly release your grip. Repeat 10 times. Complete this exercise 1-2 times a day. This information is not intended to replace advice given to you by your health care provider. Make sure you discuss any questions you have with your healthcare provider. Document Revised: 03/29/2020 Document Reviewed: 03/29/2020 Elsevier Patient Education  [place].

## 2022-05-13 NOTE — Progress Notes (Signed)
Return patient Visit  Assessment: Rachael Rios is a 71 y.o. female with the following: 1. Closed fracture of right wrist; healing appropriately 2.  Right long trigger finger  Plan: She continues to have pain in the right wrist, following healing of a nonoperative right distal radius fracture.  I have stressed to her the importance of working on her range of motion.  As her range of motion improves, anticipate that her pain and strength will also get better.  I demonstrated activities and exercises to improve her range of motion.  All questions were answered.  She has also been complaining of triggering of the right long finger.  I have offered her an injection, and she elected to proceed.  This was completed in clinic today.  Procedure note injection - Right Long finger A1 Pulley  Verbal consent was obtained to inject the Right Long finger A1 pulley Timeout was completed to confirm the site of injection.  The skin was prepped with alcohol and ethyl chloride was sprayed at the injection site.  A 21-gauge needle was used to inject 40 mg of Depo-Medrol and 1% lidocaine (1 cc) into the Right Long finger using a direct anterior approach.  There were no complications. Patient tolerated the procedure well. A sterile bandage was applied   Follow-up: Return if symptoms worsen or fail to improve.  Subjective:  Chief Complaint  Patient presents with   Fracture    Rt wrist DOI 11/23/21    History of Present Illness: Rachael Rios is a 71 y.o. female who presents for repeat evaluation of right wrist.  It has been several months since she sustained a right distal radius fracture.  She presents today because she continues to have pain.  She notes stiffness in her right wrist.  She has been doing some exercises, but has not been pushing the range of motion.  She has some difficulty with activities of daily living due to restricted range of motion and associated pain.  She has also noted catching of  the right long finger.  This is causing her discomfort.  This gets worse that she works out her range of motion and making a full fist.  Review of Systems: No fevers or chills No numbness or tingling No chest pain No shortness of breath No bowel or bladder dysfunction No GI distress No headaches  Objective: Ht '5\' 5"'$  (1.651 m)   Wt 111 lb (50.3 kg)   BMI 18.47 kg/m   Physical Exam:  General: Alert and oriented. and No acute distress. Gait: Normal gait.  Mild deformity of the right wrist.  No swelling.  No bruising.  Restricted flexion and extension.  Slightly restricted supination and pronation.  4+/5 grip strength.  Tenderness to palpation of the A1 pulley to the right long finger.  There is active triggering noted.  IMAGING: I personally ordered and reviewed the following images  No new x-rays obtained today.  New Medications:  No orders of the defined types were placed in this encounter.     Mordecai Rasmussen, MD  05/13/2022 12:56 PM

## 2022-05-19 DIAGNOSIS — H5200 Hypermetropia, unspecified eye: Secondary | ICD-10-CM | POA: Diagnosis not present

## 2022-05-26 DIAGNOSIS — Z1331 Encounter for screening for depression: Secondary | ICD-10-CM | POA: Diagnosis not present

## 2022-05-26 DIAGNOSIS — E039 Hypothyroidism, unspecified: Secondary | ICD-10-CM | POA: Diagnosis not present

## 2022-05-26 DIAGNOSIS — E559 Vitamin D deficiency, unspecified: Secondary | ICD-10-CM | POA: Diagnosis not present

## 2022-05-26 DIAGNOSIS — Z0001 Encounter for general adult medical examination with abnormal findings: Secondary | ICD-10-CM | POA: Diagnosis not present

## 2022-05-26 DIAGNOSIS — D518 Other vitamin B12 deficiency anemias: Secondary | ICD-10-CM | POA: Diagnosis not present

## 2022-05-26 DIAGNOSIS — Z681 Body mass index (BMI) 19 or less, adult: Secondary | ICD-10-CM | POA: Diagnosis not present

## 2022-05-26 DIAGNOSIS — M810 Age-related osteoporosis without current pathological fracture: Secondary | ICD-10-CM | POA: Diagnosis not present

## 2022-06-06 ENCOUNTER — Other Ambulatory Visit (HOSPITAL_COMMUNITY): Payer: Self-pay | Admitting: Internal Medicine

## 2022-06-06 DIAGNOSIS — E2839 Other primary ovarian failure: Secondary | ICD-10-CM

## 2022-08-25 DIAGNOSIS — C4359 Malignant melanoma of other part of trunk: Secondary | ICD-10-CM | POA: Diagnosis not present

## 2022-08-25 DIAGNOSIS — Z0189 Encounter for other specified special examinations: Secondary | ICD-10-CM | POA: Diagnosis not present

## 2022-08-25 DIAGNOSIS — L820 Inflamed seborrheic keratosis: Secondary | ICD-10-CM | POA: Diagnosis not present

## 2022-08-25 DIAGNOSIS — C44599 Other specified malignant neoplasm of skin of other part of trunk: Secondary | ICD-10-CM | POA: Diagnosis not present

## 2022-09-10 DIAGNOSIS — H906 Mixed conductive and sensorineural hearing loss, bilateral: Secondary | ICD-10-CM | POA: Diagnosis not present

## 2022-09-10 DIAGNOSIS — H7202 Central perforation of tympanic membrane, left ear: Secondary | ICD-10-CM | POA: Diagnosis not present

## 2022-09-10 DIAGNOSIS — H6123 Impacted cerumen, bilateral: Secondary | ICD-10-CM | POA: Diagnosis not present

## 2022-09-15 DIAGNOSIS — L98429 Non-pressure chronic ulcer of back with unspecified severity: Secondary | ICD-10-CM | POA: Diagnosis not present

## 2022-09-15 DIAGNOSIS — C4359 Malignant melanoma of other part of trunk: Secondary | ICD-10-CM | POA: Diagnosis not present

## 2022-12-05 ENCOUNTER — Other Ambulatory Visit (HOSPITAL_COMMUNITY): Payer: Self-pay | Admitting: Internal Medicine

## 2022-12-05 DIAGNOSIS — Z1231 Encounter for screening mammogram for malignant neoplasm of breast: Secondary | ICD-10-CM

## 2022-12-11 ENCOUNTER — Ambulatory Visit (HOSPITAL_COMMUNITY)
Admission: RE | Admit: 2022-12-11 | Discharge: 2022-12-11 | Disposition: A | Discharge status: Home / Self Care | Payer: Medicare HMO | Source: Ambulatory Visit | Attending: Internal Medicine | Admitting: Internal Medicine

## 2022-12-11 DIAGNOSIS — E2839 Other primary ovarian failure: Secondary | ICD-10-CM | POA: Insufficient documentation

## 2022-12-11 DIAGNOSIS — Z1231 Encounter for screening mammogram for malignant neoplasm of breast: Secondary | ICD-10-CM | POA: Diagnosis not present

## 2022-12-11 DIAGNOSIS — M810 Age-related osteoporosis without current pathological fracture: Secondary | ICD-10-CM | POA: Diagnosis not present

## 2023-01-05 DIAGNOSIS — Z85820 Personal history of malignant melanoma of skin: Secondary | ICD-10-CM | POA: Diagnosis not present

## 2023-01-05 DIAGNOSIS — Z1283 Encounter for screening for malignant neoplasm of skin: Secondary | ICD-10-CM | POA: Diagnosis not present

## 2023-01-05 DIAGNOSIS — D225 Melanocytic nevi of trunk: Secondary | ICD-10-CM | POA: Diagnosis not present

## 2023-01-05 DIAGNOSIS — L820 Inflamed seborrheic keratosis: Secondary | ICD-10-CM | POA: Diagnosis not present

## 2023-01-05 DIAGNOSIS — Z08 Encounter for follow-up examination after completed treatment for malignant neoplasm: Secondary | ICD-10-CM | POA: Diagnosis not present

## 2023-05-21 DIAGNOSIS — H43393 Other vitreous opacities, bilateral: Secondary | ICD-10-CM | POA: Diagnosis not present

## 2023-06-15 IMAGING — MG MM DIGITAL SCREENING BILAT W/ TOMO AND CAD
8 series · 9 of 24 positions shown · non-contrast
Comparison: Previous exam(s).

CLINICAL DATA: Screening.

EXAM:
DIGITAL SCREENING BILATERAL MAMMOGRAM WITH TOMOSYNTHESIS AND CAD
TECHNIQUE: Bilateral screening digital craniocaudal and mediolateral oblique
mammograms were obtained. Bilateral screening digital breast
tomosynthesis was performed. The images were evaluated with
computer-aided detection.

[L MLO synth-2D]
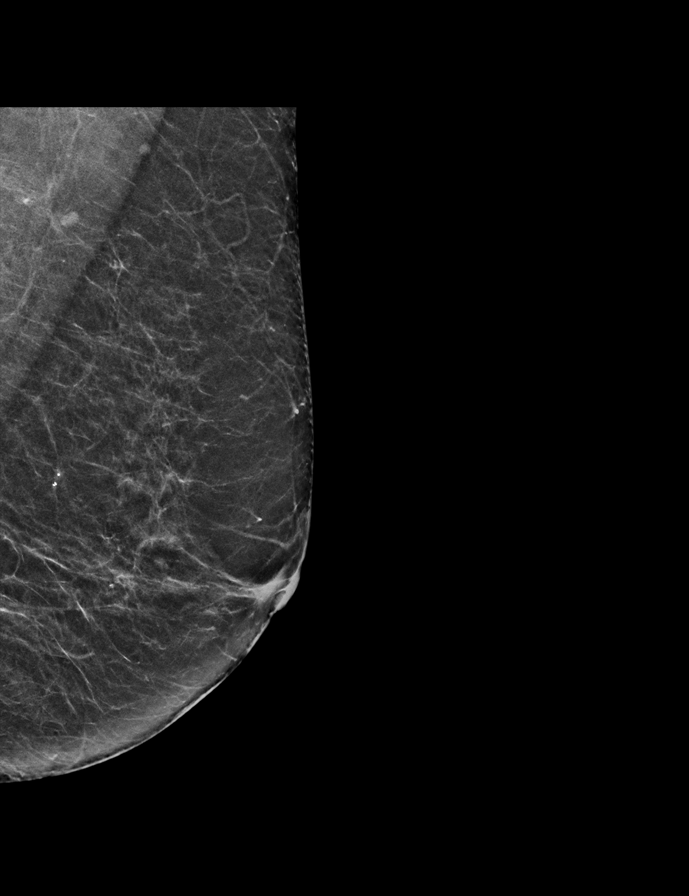

[R MLO synth-2D]
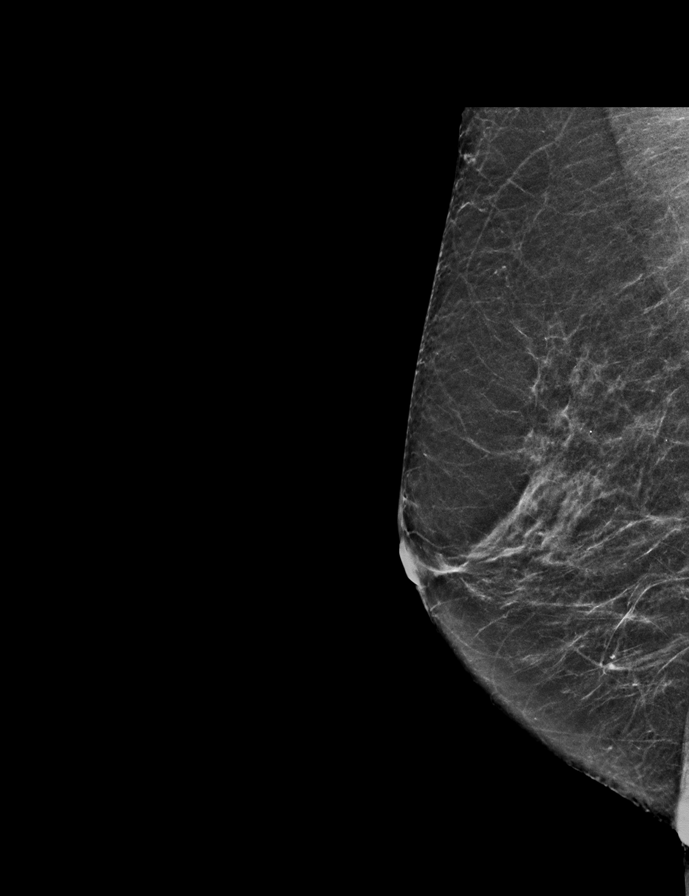

[R CC synth-2D]
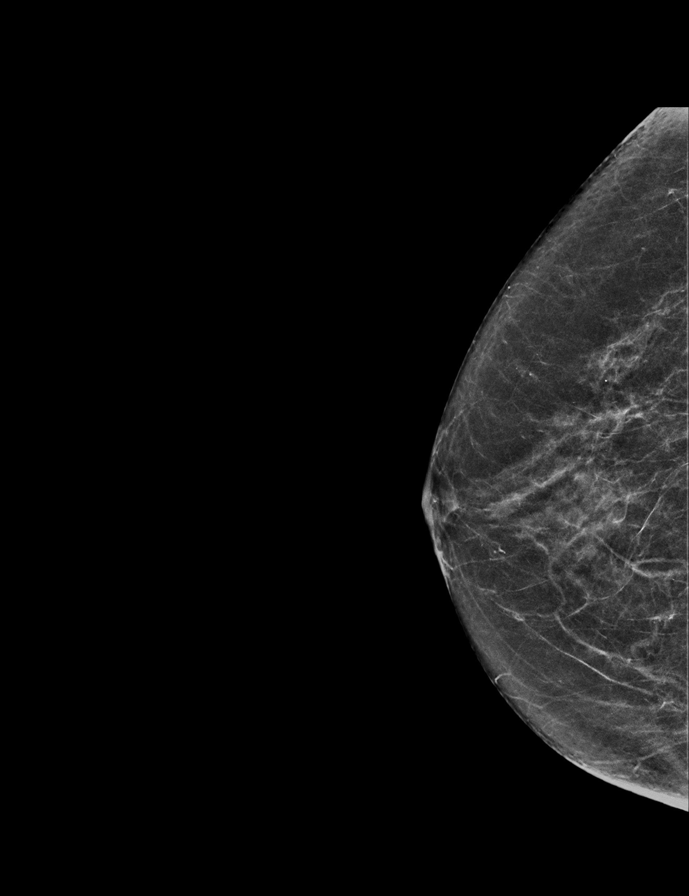

[L CC synth-2D]
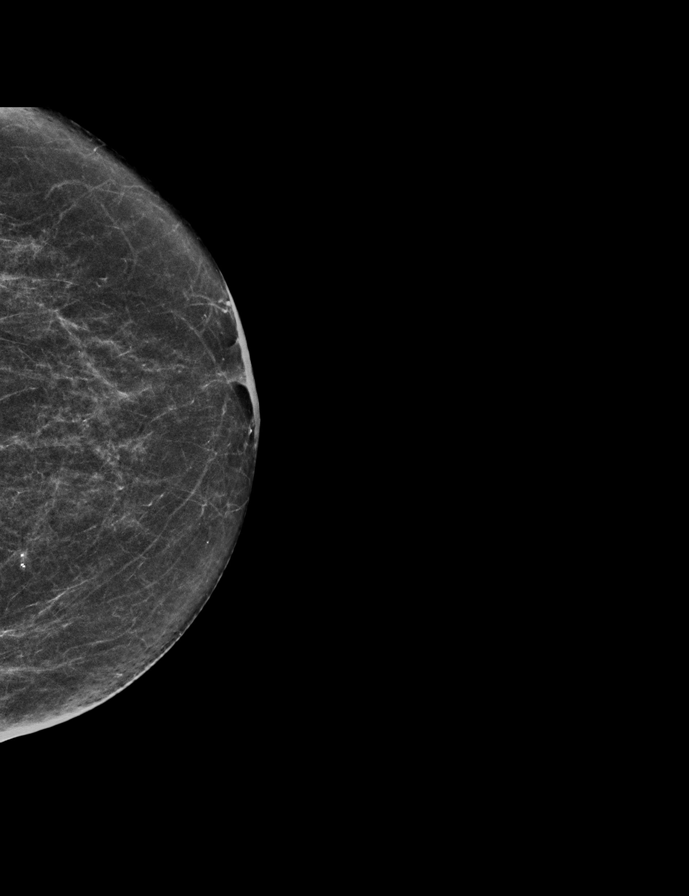

[L MLO tomo · 2 of 51 frames shown]
[frame 17/51]
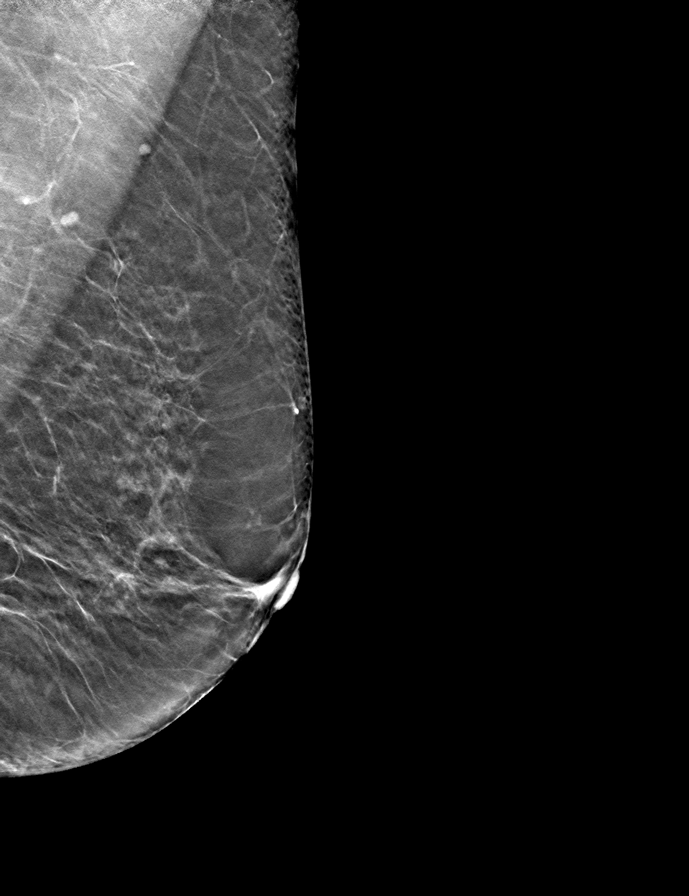
[frame 26/51]
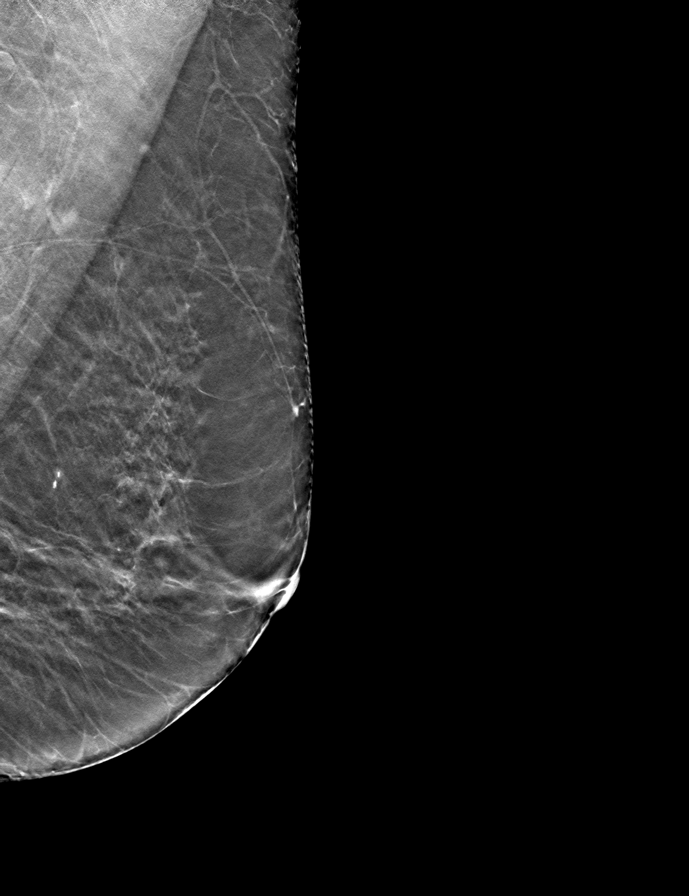

[R CC tomo · tomo slice 27/52.0]
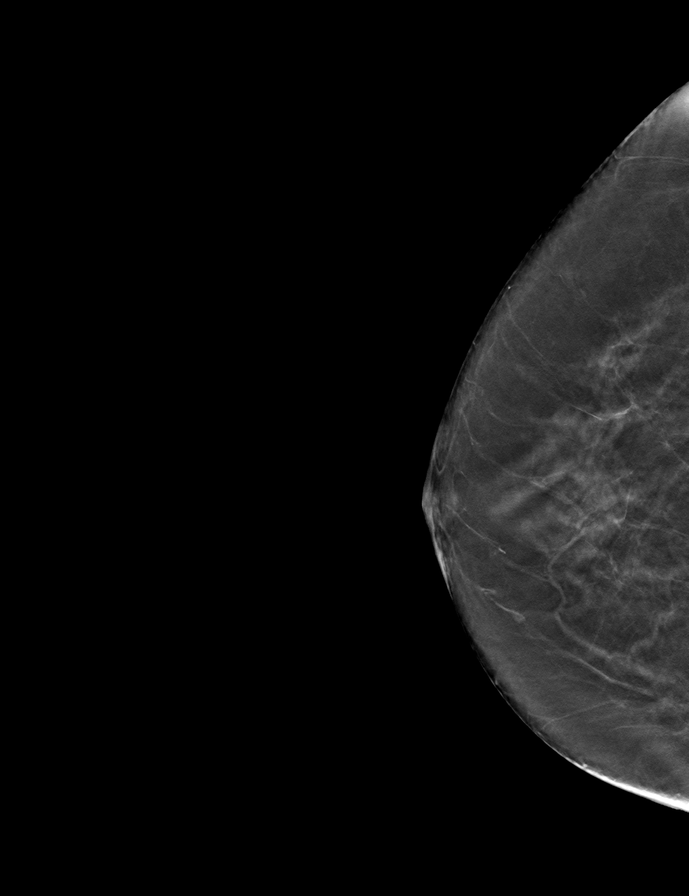

[L CC tomo · tomo slice 25/49.0]
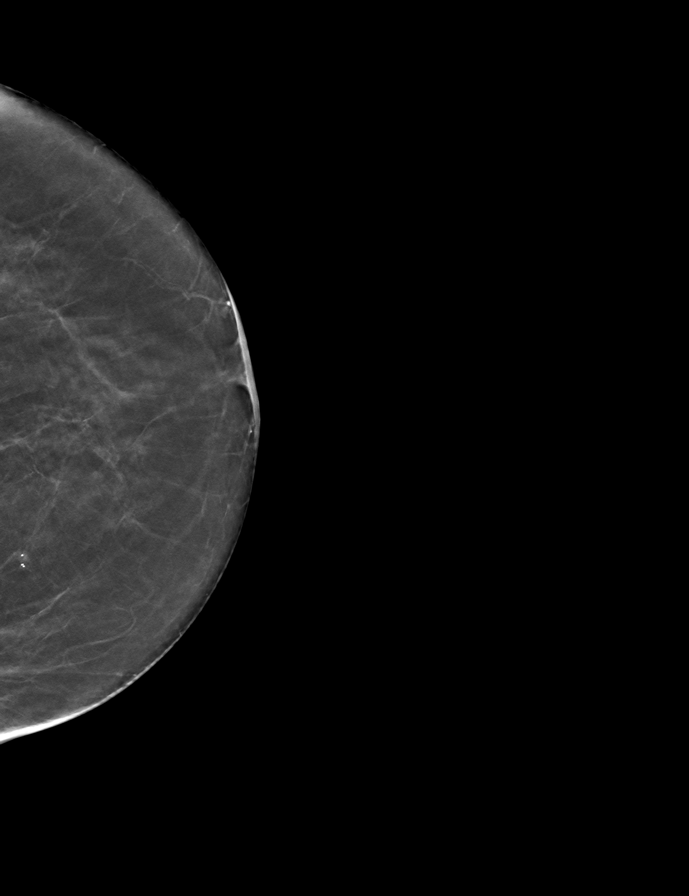

[R MLO tomo · tomo slice 27/52.0]
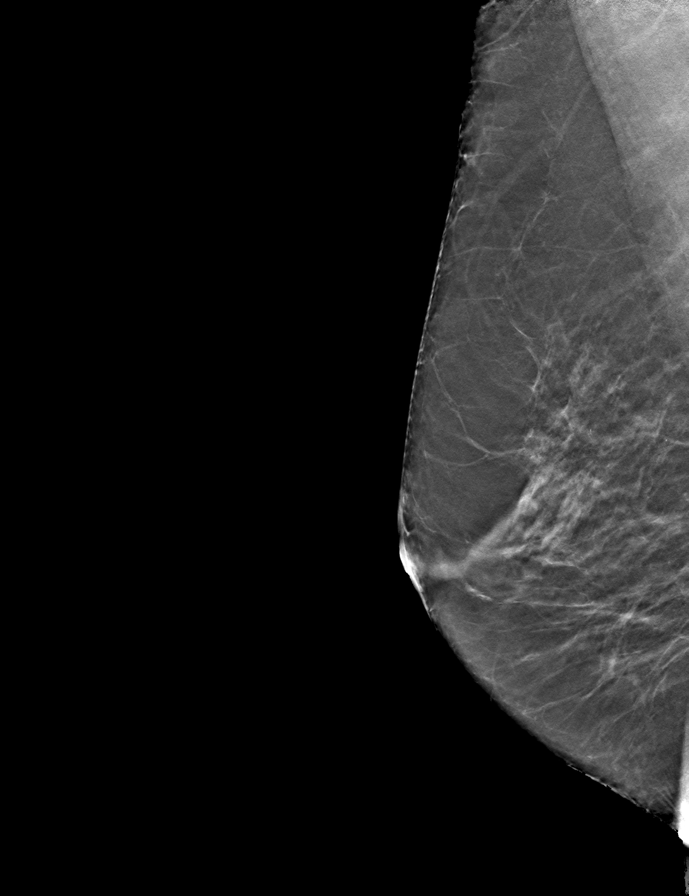

[9 of 24 positions shown; findings below may reference images not displayed]

ACR Breast Density Category b: There are scattered areas of
fibroglandular density.
FINDINGS: There are no findings suspicious for malignancy.
IMPRESSION: No mammographic evidence of malignancy. A result letter of this
screening mammogram will be mailed directly to the patient.

RECOMMENDATION:
Screening mammogram in one year. (Code:51-O-LD2)

BI-RADS CATEGORY  1: Negative.

## 2023-06-25 DIAGNOSIS — E2839 Other primary ovarian failure: Secondary | ICD-10-CM | POA: Diagnosis not present

## 2023-06-25 DIAGNOSIS — Z0001 Encounter for general adult medical examination with abnormal findings: Secondary | ICD-10-CM | POA: Diagnosis not present

## 2023-06-25 DIAGNOSIS — D518 Other vitamin B12 deficiency anemias: Secondary | ICD-10-CM | POA: Diagnosis not present

## 2023-06-25 DIAGNOSIS — Z6820 Body mass index (BMI) 20.0-20.9, adult: Secondary | ICD-10-CM | POA: Diagnosis not present

## 2023-06-25 DIAGNOSIS — I10 Essential (primary) hypertension: Secondary | ICD-10-CM | POA: Diagnosis not present

## 2023-06-25 DIAGNOSIS — Z1331 Encounter for screening for depression: Secondary | ICD-10-CM | POA: Diagnosis not present

## 2023-06-25 DIAGNOSIS — E039 Hypothyroidism, unspecified: Secondary | ICD-10-CM | POA: Diagnosis not present

## 2023-06-25 DIAGNOSIS — M810 Age-related osteoporosis without current pathological fracture: Secondary | ICD-10-CM | POA: Diagnosis not present

## 2023-06-25 DIAGNOSIS — E559 Vitamin D deficiency, unspecified: Secondary | ICD-10-CM | POA: Diagnosis not present

## 2023-07-20 DIAGNOSIS — L820 Inflamed seborrheic keratosis: Secondary | ICD-10-CM | POA: Diagnosis not present

## 2023-07-20 DIAGNOSIS — Z08 Encounter for follow-up examination after completed treatment for malignant neoplasm: Secondary | ICD-10-CM | POA: Diagnosis not present

## 2023-07-20 DIAGNOSIS — Z85820 Personal history of malignant melanoma of skin: Secondary | ICD-10-CM | POA: Diagnosis not present

## 2023-07-20 DIAGNOSIS — D225 Melanocytic nevi of trunk: Secondary | ICD-10-CM | POA: Diagnosis not present

## 2023-07-20 DIAGNOSIS — Z1283 Encounter for screening for malignant neoplasm of skin: Secondary | ICD-10-CM | POA: Diagnosis not present

## 2023-08-12 DIAGNOSIS — Z23 Encounter for immunization: Secondary | ICD-10-CM | POA: Diagnosis not present

## 2023-09-16 ENCOUNTER — Encounter (INDEPENDENT_AMBULATORY_CARE_PROVIDER_SITE_OTHER): Payer: Self-pay

## 2023-09-16 ENCOUNTER — Ambulatory Visit (INDEPENDENT_AMBULATORY_CARE_PROVIDER_SITE_OTHER): Payer: Medicare HMO | Admitting: Otolaryngology

## 2023-09-16 VITALS — Ht 64.0 in | Wt 115.0 lb

## 2023-09-16 DIAGNOSIS — H7202 Central perforation of tympanic membrane, left ear: Secondary | ICD-10-CM

## 2023-09-16 DIAGNOSIS — H6123 Impacted cerumen, bilateral: Secondary | ICD-10-CM | POA: Diagnosis not present

## 2023-09-16 DIAGNOSIS — H6981 Other specified disorders of Eustachian tube, right ear: Secondary | ICD-10-CM | POA: Diagnosis not present

## 2023-09-16 NOTE — Progress Notes (Signed)
Patient ID: Rachael Rios, female   DOB: [birth year], 72 y.o.   MRN: [id number]  Follow-up: Left TM perforation, recurrent ear infections  HPI: The patient is a 72 year old female who returns today for her follow-up evaluation.  The patient has a history of recurrent ear infections and a left TM perforation.  At her last visit 1 year ago, she was noted to have a dry left TM perforation.  The right tympanic membrane was intact but retracted.  She also has a history of frequent recurrent cerumen impaction.  The patient returns today complaining of intermittent right ear drainage.  Currently she denies any otalgia, vertigo, or change in her hearing.  Exam: General: Communicates without difficulty, well nourished, no acute distress. Head: Normocephalic, no evidence injury, no tenderness, facial buttresses intact without stepoff. Face/sinus: No tenderness to palpation and percussion. Facial movement is normal and symmetric. Eyes: PERRL, EOMI. No scleral icterus, conjunctivae clear. Neuro: CN II exam reveals vision grossly intact. No nystagmus at any point of gaze. Ears: Auricles well formed without lesions.  Bilateral cerumen impaction. Nose: External evaluation reveals normal support and skin without lesions. Dorsum is intact. Anterior rhinoscopy reveals healthy pink mucosa over anterior aspect of inferior turbinates and intact septum. No purulence noted. Oral:  Oral cavity and oropharynx are intact, symmetric, without erythema or edema. Mucosa is moist without lesions. Neck: Full range of motion without pain. There is no significant lymphadenopathy. No masses palpable. Thyroid bed within normal limits to palpation. Parotid glands and submandibular glands equal bilaterally without mass. Trachea is midline. Neuro:  CN 2-12 grossly intact. Gait normal. Vestibular: No nystagmus at any point of gaze. The cerebellar examination is unremarkable.   Procedure: Bilateral cerumen disimpaction.  Anesthesia: None.   Description: Under the operating microscope, the cerumen is carefully removed with a combination of cerumen currette, alligator forceps, and suction catheters. After the cerumen is removed, the left TM perforation is noted.  The right tympanic membrane is intact but retracted with a small amount of polypoid tissue inferiorly.  Assessment: 1.  Bilateral recurrent cerumen impaction. 2.  After the cerumen removal procedure, the right tympanic membrane is noted to be severely retracted, with a small amount of polypoid tissue inferiorly. 3.  Stable left tympanic membrane perforation.  Plan: 1.  Otomicroscopy with bilateral cerumen disimpaction. 2.  Ciprodex eardrops 4 drops right ear twice daily for 1 week. 3.  Dry ear precautions on the left side. 4.  The patient will return for reevaluation in 1 year.

## 2023-11-04 ENCOUNTER — Other Ambulatory Visit (HOSPITAL_COMMUNITY): Payer: Self-pay | Admitting: Internal Medicine

## 2023-11-04 DIAGNOSIS — Z1231 Encounter for screening mammogram for malignant neoplasm of breast: Secondary | ICD-10-CM

## 2023-12-14 ENCOUNTER — Ambulatory Visit (HOSPITAL_COMMUNITY): Payer: Medicare HMO

## 2024-01-11 ENCOUNTER — Ambulatory Visit (HOSPITAL_COMMUNITY)
Admission: RE | Admit: 2024-01-11 | Discharge: 2024-01-11 | Disposition: A | Discharge status: Home / Self Care | Payer: Medicare HMO | Source: Ambulatory Visit | Attending: Internal Medicine | Admitting: Internal Medicine

## 2024-01-11 ENCOUNTER — Encounter (HOSPITAL_COMMUNITY): Payer: Self-pay

## 2024-01-11 DIAGNOSIS — Z1231 Encounter for screening mammogram for malignant neoplasm of breast: Secondary | ICD-10-CM | POA: Diagnosis not present

## 2024-05-23 DIAGNOSIS — H43311 Vitreous membranes and strands, right eye: Secondary | ICD-10-CM | POA: Diagnosis not present

## 2024-05-31 ENCOUNTER — Ambulatory Visit: Payer: Self-pay

## 2024-09-19 ENCOUNTER — Ambulatory Visit (INDEPENDENT_AMBULATORY_CARE_PROVIDER_SITE_OTHER): Admitting: Otolaryngology

## 2024-09-19 ENCOUNTER — Encounter (INDEPENDENT_AMBULATORY_CARE_PROVIDER_SITE_OTHER): Payer: Self-pay | Admitting: Otolaryngology

## 2024-09-19 VITALS — BP 146/99 | HR 94 | Temp 98.0°F | Ht 64.0 in | Wt 124.0 lb

## 2024-09-19 DIAGNOSIS — H7292 Unspecified perforation of tympanic membrane, left ear: Secondary | ICD-10-CM

## 2024-09-19 DIAGNOSIS — H9391 Unspecified disorder of right ear: Secondary | ICD-10-CM

## 2024-09-19 DIAGNOSIS — H6981 Other specified disorders of Eustachian tube, right ear: Secondary | ICD-10-CM

## 2024-09-19 DIAGNOSIS — H7202 Central perforation of tympanic membrane, left ear: Secondary | ICD-10-CM

## 2024-09-19 DIAGNOSIS — H6123 Impacted cerumen, bilateral: Secondary | ICD-10-CM

## 2024-09-19 NOTE — Progress Notes (Signed)
 Patient ID: Rachael Rios, female   DOB: 08-28-1951, 73 y.o.   MRN: [id number]  Follow-up: Left TM perforation, recurrent ear infections  HPI: The patient is a 73 year old female who returns today for her follow-up evaluation.  The patient has a history of recurrent ear infections and a left TM perforation.  At her last visit 1 year ago, she was noted to have a dry left TM perforation.  The right tympanic membrane was intact but retracted, with polypoid tissue partially covering the tympanic membrane.  She was treated with Ciprodex eardrops.  She also has a history of frequent recurrent cerumen impaction.  The patient returns today complaining of intermittent right ear drainage.  Currently she denies any otalgia, vertigo, or change in her hearing.  Exam: General: Communicates without difficulty, well nourished, no acute distress. Head: Normocephalic, no evidence injury, no tenderness, facial buttresses intact without stepoff. Face/sinus: No tenderness to palpation and percussion. Facial movement is normal and symmetric. Eyes: PERRL, EOMI. No scleral icterus, conjunctivae clear. Neuro: CN II exam reveals vision grossly intact. No nystagmus at any point of gaze. Ears: Auricles well formed without lesions.  Bilateral cerumen impaction. Nose: External evaluation reveals normal support and skin without lesions. Dorsum is intact. Anterior rhinoscopy reveals healthy pink mucosa over anterior aspect of inferior turbinates and intact septum. No purulence noted. Oral:  Oral cavity and oropharynx are intact, symmetric, without erythema or edema. Mucosa is moist without lesions. Neck: Full range of motion without pain. There is no significant lymphadenopathy. No masses palpable. Thyroid  bed within normal limits to palpation. Parotid glands and submandibular glands equal bilaterally without mass. Trachea is midline. Neuro:  CN 2-12 grossly intact. Gait normal. Vestibular: No nystagmus at any point of gaze. The cerebellar  examination is unremarkable.   Procedure: Bilateral cerumen disimpaction.  Anesthesia: None.  Description: Under the operating microscope, the cerumen is carefully removed with a combination of cerumen currette, alligator forceps, and suction catheters. After the cerumen is removed, the left TM perforation is noted.  The right tympanic membrane is intact but retracted.  Assessment: 1.  Bilateral recurrent cerumen impaction. 2.  After the cerumen removal procedure, the right tympanic membrane is noted to be severely retracted. 3.  Stable left tympanic membrane perforation.  Plan: 1.  Otomicroscopy with bilateral cerumen disimpaction. 2.  Ciprodex eardrops 4 drops right ear twice daily as needed to treat any recurrent otorrhea. 3.  Dry ear precautions on the left side. 4.  The patient will return for reevaluation in 1 year.

## 2024-09-27 ENCOUNTER — Ambulatory Visit
Admission: EM | Admit: 2024-09-27 | Discharge: 2024-09-27 | Disposition: A | Discharge status: Home / Self Care | Attending: Family Medicine | Admitting: Family Medicine

## 2024-09-27 DIAGNOSIS — J069 Acute upper respiratory infection, unspecified: Secondary | ICD-10-CM

## 2024-09-27 LAB — POC SOFIA SARS ANTIGEN FIA: SARS Coronavirus 2 Ag: NEGATIVE

## 2024-09-27 MED ORDER — AZELASTINE HCL 0.1 % NA SOLN: 1.0000 | Freq: Two times a day (BID) | NASAL | 0 refills | Status: AC

## 2024-09-27 MED ORDER — PROMETHAZINE-DM 6.25-15 MG/5ML PO SYRP: 5.0000 mL | ORAL_SOLUTION | Freq: Four times a day (QID) | ORAL | 0 refills | Status: AC | PRN

## 2024-09-27 NOTE — ED Triage Notes (Signed)
 Pt reports she has a cough with phlem and throat painx 3 days

## 2024-09-27 NOTE — ED Provider Notes (Signed)
 [facility]    CSN: [id number] Arrival date & time: 09/27/24  9144      History   Chief Complaint No chief complaint on file.   HPI Rachael Rios is a 73 y.o. female.   Patient presenting today with 3-day history of productive cough, sore throat, nasal congestion.  Denies fever, chills, chest pain, shortness of breath, abdominal pain, vomiting, diarrhea.  Denies history of chronic pulmonary disease.  So far not trying anything over-the-counter for symptoms.    Past Medical History:  Diagnosis Date   Cataract    Hypertension    PONV (postoperative nausea and vomiting)     Patient Active Problem List   Diagnosis Date Noted   Central perforation of tympanic membrane of left ear 09/16/2023   Other specified disorders of eustachian tube, right ear 09/16/2023   Impacted cerumen of both ears 09/16/2023   Fall 11/23/2021   Humerus fracture 11/23/2021   Wrist fracture 11/23/2021   Essential hypertension 11/23/2021   Special screening for malignant neoplasms, colon    Rotator cuff syndrome of left shoulder 02/25/2012    Past Surgical History:  Procedure Laterality Date   CATARACT EXTRACTION W/PHACO Right 03/26/2015   Procedure: CATARACT EXTRACTION PHACO AND INTRAOCULAR LENS PLACEMENT (IOC);  Surgeon: Cherene Mania, MD;  Location: [facility];  Service: Ophthalmology;  Laterality: Right;  CDE 5.46   CATARACT EXTRACTION W/PHACO Left 04/26/2015   Procedure: CATARACT EXTRACTION PHACO AND INTRAOCULAR LENS PLACEMENT (IOC);  Surgeon: Cherene Mania, MD;  Location: [facility];  Service: Ophthalmology;  Laterality: Left;  CDE 6.29   COLONOSCOPY N/A 12/07/2015   Procedure: COLONOSCOPY;  Surgeon: Lamar CHRISTELLA Hollingshead, MD;  Location: [facility];  Service: Endoscopy;  Laterality: N/A;  10:30 AM - moved to 9:30 - office to notify   FINGER SURGERY Left 2000   Left middle finger   HAND SURGERY     INNER EAR SURGERY Bilateral 1977   left hand      OB History   No obstetric history on file.       Home Medications    Prior to Admission medications   Medication Sig Start Date End Date Taking? Authorizing Provider  azelastine (ASTELIN) 0.1 % nasal spray Place 1 spray into both nostrils 2 (two) times daily. Use in each nostril as directed 09/27/24  Yes Stuart Vernell Norris, PA-C  promethazine-dextromethorphan (PROMETHAZINE-DM) 6.25-15 MG/5ML syrup Take 5 mLs by mouth 4 (four) times daily as needed. 09/27/24  Yes Stuart Vernell Norris, PA-C  alendronate (FOSAMAX) 70 MG tablet Take 70 mg by mouth once a week. Take with a full glass of water  on an empty stomach. Takes on Sundays.    [provider]  calcium  carbonate (OSCAL) 1500 (600 CA) MG TABS tablet Take 1,500 mg by mouth 2 (two) times daily with a meal.     [provider]  fish oil-omega-3 fatty acids  1000 MG capsule Take 1 g by mouth daily.     [provider]  levothyroxine  (SYNTHROID ) 50 MCG tablet Take 50 mcg by mouth daily. 10/07/21   [provider]  losartan  (COZAAR ) 25 MG tablet Take 25 mg by mouth daily.    [provider]  Multiple Vitamins-Minerals (CENTRUM SILVER PO) Take 1 tablet by mouth daily.     [provider]  oxyCODONE  (OXY IR/ROXICODONE ) 5 MG immediate release tablet Take 1 tablet (5 mg total) by mouth every 4 (four) hours as needed for moderate pain. 11/26/21   Amin, Ankit  C, MD  polyethylene glycol (MIRALAX  / GLYCOLAX ) 17 g packet Take 17 g by mouth daily as needed for mild constipation. 11/26/21   Amin, Ankit C, MD  senna-docusate (SENOKOT-S) 8.6-50 MG tablet Take 1 tablet by mouth at bedtime. 11/26/21   Caleen Burgess BROCKS, MD    Family History Family History  Problem Relation Age of Onset   Diabetes Unknown     Social History Social History   Tobacco Use   Smoking status: Never  Substance Use Topics   Alcohol use: No   Drug use: No     Allergies   Patient has no known allergies.   Review of Systems Review of Systems Per HPI  Physical  Exam Triage Vital Signs ED Triage Vitals  Encounter Vitals Group     BP 09/27/24 0915 135/80     Girls Systolic BP Percentile --      Girls Diastolic BP Percentile --      Boys Systolic BP Percentile --      Boys Diastolic BP Percentile --      Pulse Rate 09/27/24 0915 (!) 118     Resp 09/27/24 0915 18     Temp 09/27/24 0915 98.7 F (37.1 C)     Temp Source 09/27/24 0915 Oral     SpO2 09/27/24 0915 96 %     Weight --      Height --      Head Circumference --      Peak Flow --      Pain Score 09/27/24 0917 0     Pain Loc --      Pain Education --      Exclude from Growth Chart --    No data found.  Updated Vital Signs BP 135/80 (BP Location: Right Arm)   Pulse (!) 118   Temp 98.7 F (37.1 C) (Oral)   Resp 18   SpO2 96%   Visual Acuity Right Eye Distance:   Left Eye Distance:   Bilateral Distance:    Right Eye Near:   Left Eye Near:    Bilateral Near:     Physical Exam Vitals and nursing note reviewed.  Constitutional:      Appearance: Normal appearance.  HENT:     Head: Atraumatic.     Right Ear: Tympanic membrane and external ear normal.     Left Ear: Tympanic membrane and external ear normal.     Nose: Rhinorrhea present.     Mouth/Throat:     Mouth: Mucous membranes are moist.     Pharynx: Posterior oropharyngeal erythema present.  Eyes:     Extraocular Movements: Extraocular movements intact.     Conjunctiva/sclera: Conjunctivae normal.  Cardiovascular:     Rate and Rhythm: Normal rate and regular rhythm.     Heart sounds: Normal heart sounds.  Pulmonary:     Effort: Pulmonary effort is normal.     Breath sounds: Normal breath sounds. No wheezing or rales.  Musculoskeletal:        General: Normal range of motion.     Cervical back: Normal range of motion and neck supple.  Skin:    General: Skin is warm and dry.  Neurological:     Mental Status: She is alert and oriented to person, place, and time.  Psychiatric:        Mood and Affect: Mood  normal.        Thought Content: Thought content normal.      UC Treatments /  Results  Labs (all labs ordered are listed, but only abnormal results are displayed) Labs Reviewed  POC SOFIA SARS ANTIGEN FIA    EKG   Radiology No results found.  Procedures Procedures (including critical care time)  Medications Ordered in UC Medications - No data to display  Initial Impression / Assessment and Plan / UC Course  I have reviewed the triage vital signs and the nursing notes.  Pertinent labs & imaging results that were available during my care of the patient were reviewed by me and considered in my medical decision making (see chart for details).     Mildly tachycardic in triage, otherwise vital signs within normal limits.  She is well-appearing and in no acute distress.  Rapid COVID-negative, suspect viral respiratory infection.  Treat with Astelin, Phenergan DM, supportive over-the-counter medications at home care.  Return for worsening or unresolving symptoms.  Final Clinical Impressions(s) / UC Diagnoses   Final diagnoses:  Viral URI with cough     Discharge Instructions      In addition to the prescribed medications, you may take Coricidin HBP, plain Mucinex , nasal saline rinses, humidifiers, drink plenty of fluids and get lots of rest.  Follow-up for significantly worsening symptoms.    ED Prescriptions     Medication Sig Dispense Auth. Provider   azelastine (ASTELIN) 0.1 % nasal spray Place 1 spray into both nostrils 2 (two) times daily. Use in each nostril as directed 30 mL Stuart Vernell Norris, PA-C   promethazine-dextromethorphan (PROMETHAZINE-DM) 6.25-15 MG/5ML syrup Take 5 mLs by mouth 4 (four) times daily as needed. 100 mL Stuart Vernell Norris, [place]      PDMP not reviewed this encounter.   Stuart Vernell Norris, [place] 09/27/24 1610

## 2024-09-27 NOTE — Discharge Instructions (Signed)
 In addition to the prescribed medications, you may take Coricidin HBP, plain Mucinex , nasal saline rinses, humidifiers, drink plenty of fluids and get lots of rest.  Follow-up for significantly worsening symptoms.
